# Patient Record
Sex: Female | Born: 1950 | Race: White | Hispanic: No | Marital: Single | Smoking: Never smoker
Health system: Southern US, Community
[De-identification: ages and names within clinical notes are randomized; demographics above are authoritative.]

## PROBLEM LIST (undated history)

## (undated) DIAGNOSIS — R011 Cardiac murmur, unspecified: Secondary | ICD-10-CM

## (undated) DIAGNOSIS — Z9889 Other specified postprocedural states: Secondary | ICD-10-CM

## (undated) DIAGNOSIS — R945 Abnormal results of liver function studies: Secondary | ICD-10-CM

## (undated) DIAGNOSIS — N200 Calculus of kidney: Secondary | ICD-10-CM

## (undated) DIAGNOSIS — F640 Transsexualism: Secondary | ICD-10-CM

## (undated) DIAGNOSIS — Z8619 Personal history of other infectious and parasitic diseases: Secondary | ICD-10-CM

## (undated) DIAGNOSIS — Z85828 Personal history of other malignant neoplasm of skin: Secondary | ICD-10-CM

## (undated) DIAGNOSIS — T148XXA Other injury of unspecified body region, initial encounter: Secondary | ICD-10-CM

## (undated) DIAGNOSIS — F4323 Adjustment disorder with mixed anxiety and depressed mood: Secondary | ICD-10-CM

## (undated) HISTORY — DX: Personal history of other malignant neoplasm of skin: Z85.828

## (undated) HISTORY — DX: Transsexualism: F64.0

## (undated) HISTORY — DX: Personal history of other infectious and parasitic diseases: Z86.19

## (undated) HISTORY — PX: MOHS SURGERY: SUR867

## (undated) HISTORY — DX: Other injury of unspecified body region, initial encounter: T14.8XXA

## (undated) HISTORY — DX: Abnormal results of liver function studies: R94.5

## (undated) HISTORY — PX: TONSILLECTOMY: SUR1361

## (undated) HISTORY — DX: Cardiac murmur, unspecified: R01.1

## (undated) HISTORY — DX: Adjustment disorder with mixed anxiety and depressed mood: F43.23

## (undated) HISTORY — DX: Other specified postprocedural states: Z98.890

## (undated) HISTORY — DX: Calculus of kidney: N20.0

---

## 1997-01-17 HISTORY — PX: OTHER SURGICAL HISTORY: SHX169

## 2001-03-19 HISTORY — PX: OTHER SURGICAL HISTORY: SHX169

## 2003-04-19 LAB — HM COLONOSCOPY: HM Colonoscopy: NORMAL

## 2008-03-06 ENCOUNTER — Ambulatory Visit: Payer: Self-pay | Admitting: Internal Medicine

## 2008-03-06 DIAGNOSIS — F4323 Adjustment disorder with mixed anxiety and depressed mood: Secondary | ICD-10-CM

## 2008-03-06 DIAGNOSIS — F640 Transsexualism: Secondary | ICD-10-CM | POA: Insufficient documentation

## 2008-03-06 HISTORY — DX: Adjustment disorder with mixed anxiety and depressed mood: F43.23

## 2008-03-06 LAB — CONVERTED CEMR LAB
AST: 36 units/L (ref 0–37)
Alkaline Phosphatase: 41 units/L (ref 39–117)
Bilirubin Urine: NEGATIVE
Bilirubin, Direct: 0.1 mg/dL (ref 0.0–0.3)
Blood in Urine, dipstick: NEGATIVE
Chloride: 102 meq/L (ref 96–112)
Cholesterol: 248 mg/dL (ref 0–200)
Eosinophils Absolute: 0.1 10*3/uL (ref 0.0–0.7)
Eosinophils Relative: 1.1 % (ref 0.0–5.0)
GFR calc Af Amer: 73 mL/min
GFR calc non Af Amer: 61 mL/min
Glucose, Bld: 90 mg/dL (ref 70–99)
HCT: 39.8 % (ref 36.0–46.0)
HDL: 79.5 mg/dL (ref >39.0)
Monocytes Absolute: 0.3 10*3/uL (ref 0.1–1.0)
Monocytes Relative: 6 % (ref 3.0–12.0)
Neutrophils Relative %: 64.7 % (ref 43.0–77.0)
Nitrite: NEGATIVE
Platelets: 261 10*3/uL (ref 150–400)
Potassium: 4.6 meq/L (ref 3.5–5.1)
Protein, U semiquant: NEGATIVE
RDW: 12.8 % (ref 11.5–14.6)
Sodium: 140 meq/L (ref 135–145)
Total CHOL/HDL Ratio: 3.1
Triglycerides: 48 mg/dL (ref 0–149)
Urobilinogen, UA: 0.2
VLDL: 10 mg/dL (ref 0–40)
WBC: 5.4 10*3/uL (ref 4.5–10.5)

## 2008-03-29 ENCOUNTER — Ambulatory Visit: Payer: Self-pay | Admitting: Internal Medicine

## 2008-03-29 LAB — CONVERTED CEMR LAB
Bilirubin Urine: NEGATIVE
Blood in Urine, dipstick: NEGATIVE
Glucose, Urine, Semiquant: NEGATIVE
Protein, U semiquant: NEGATIVE
Urobilinogen, UA: 0.2
pH: 7

## 2008-04-04 ENCOUNTER — Ambulatory Visit: Payer: Self-pay | Admitting: Internal Medicine

## 2008-04-04 DIAGNOSIS — R945 Abnormal results of liver function studies: Secondary | ICD-10-CM

## 2008-04-04 HISTORY — DX: Abnormal results of liver function studies: R94.5

## 2008-05-03 ENCOUNTER — Telehealth: Payer: Self-pay | Admitting: *Deleted

## 2008-05-05 ENCOUNTER — Ambulatory Visit: Payer: Self-pay | Admitting: Internal Medicine

## 2008-05-05 LAB — CONVERTED CEMR LAB
HCV Ab: NEGATIVE
Hep B C IgM: NEGATIVE
Hep B S Ab: POSITIVE — AB

## 2008-05-16 LAB — CONVERTED CEMR LAB
AST: 34 units/L (ref 0–37)
Alkaline Phosphatase: 36 units/L — ABNORMAL LOW (ref 39–117)
Bilirubin, Direct: 0.1 mg/dL (ref 0.0–0.3)
Total Bilirubin: 1.3 mg/dL — ABNORMAL HIGH (ref 0.3–1.2)

## 2008-06-26 ENCOUNTER — Telehealth: Payer: Self-pay | Admitting: Internal Medicine

## 2008-09-11 ENCOUNTER — Ambulatory Visit: Payer: Self-pay | Admitting: Internal Medicine

## 2008-09-19 LAB — CONVERTED CEMR LAB
Bilirubin, Direct: 0.1 mg/dL (ref 0.0–0.3)
Total Bilirubin: 1.4 mg/dL — ABNORMAL HIGH (ref 0.3–1.2)
Total Protein: 7.4 g/dL (ref 6.0–8.3)

## 2008-09-25 ENCOUNTER — Encounter: Admission: RE | Admit: 2008-09-25 | Discharge: 2008-09-25 | Payer: Self-pay | Admitting: Internal Medicine

## 2008-09-27 ENCOUNTER — Encounter: Payer: Self-pay | Admitting: Internal Medicine

## 2008-09-27 ENCOUNTER — Telehealth: Payer: Self-pay | Admitting: Internal Medicine

## 2008-09-28 ENCOUNTER — Telehealth: Payer: Self-pay | Admitting: Internal Medicine

## 2008-10-24 ENCOUNTER — Ambulatory Visit: Payer: Self-pay | Admitting: Internal Medicine

## 2008-10-27 LAB — CONVERTED CEMR LAB
ALT: 26 units/L (ref 0–35)
AST: 28 units/L (ref 0–37)
Alkaline Phosphatase: 34 units/L — ABNORMAL LOW (ref 39–117)
Bilirubin, Direct: 0.1 mg/dL (ref 0.0–0.3)
Total Protein: 7.3 g/dL (ref 6.0–8.3)

## 2009-01-30 ENCOUNTER — Ambulatory Visit: Payer: Self-pay | Admitting: Internal Medicine

## 2009-01-30 LAB — CONVERTED CEMR LAB
Albumin: 4 g/dL (ref 3.5–5.2)
Alkaline Phosphatase: 42 units/L (ref 39–117)
Total Protein: 7.2 g/dL (ref 6.0–8.3)

## 2009-02-05 ENCOUNTER — Ambulatory Visit: Payer: Self-pay | Admitting: Internal Medicine

## 2009-05-28 ENCOUNTER — Ambulatory Visit: Payer: Self-pay | Admitting: Internal Medicine

## 2009-05-28 LAB — CONVERTED CEMR LAB
Albumin: 3.9 g/dL (ref 3.5–5.2)
Basophils Absolute: 0 10*3/uL (ref 0.0–0.1)
CO2: 29 meq/L (ref 19–32)
Eosinophils Absolute: 0.1 10*3/uL (ref 0.0–0.7)
Glucose, Bld: 82 mg/dL (ref 70–99)
Glucose, Urine, Semiquant: NEGATIVE
HCT: 39.3 % (ref 36.0–46.0)
Hemoglobin: 13.2 g/dL (ref 12.0–15.0)
Lymphs Abs: 1.3 10*3/uL (ref 0.7–4.0)
MCHC: 33.6 g/dL (ref 30.0–36.0)
Neutro Abs: 3.3 10*3/uL (ref 1.4–7.7)
Nitrite: NEGATIVE
Potassium: 4.3 meq/L (ref 3.5–5.1)
RDW: 13.4 % (ref 11.5–14.6)
Sodium: 140 meq/L (ref 135–145)
Specific Gravity, Urine: 1.02
TSH: 0.83 microintl units/mL (ref 0.35–5.50)
Triglycerides: 73 mg/dL (ref 0.0–149.0)

## 2009-06-01 ENCOUNTER — Ambulatory Visit: Payer: Self-pay | Admitting: Internal Medicine

## 2009-09-10 ENCOUNTER — Ambulatory Visit: Payer: Self-pay | Admitting: Internal Medicine

## 2009-09-10 LAB — CONVERTED CEMR LAB
Bilirubin, Direct: 0 mg/dL (ref 0.0–0.3)
Total Bilirubin: 1.1 mg/dL (ref 0.3–1.2)
Total Protein: 7.6 g/dL (ref 6.0–8.3)

## 2009-09-14 LAB — CONVERTED CEMR LAB: Anti Nuclear Antibody(ANA): NEGATIVE

## 2009-11-10 IMAGING — US US ABDOMEN COMPLETE
1 series · 14 of 25 positions shown · non-contrast
Comparison: None

CLINICAL DATA: Abnormal liver function tests.

COMPLETE ABDOMINAL ULTRASOUND

[Series 1: us abdomen complete · 0.22mm/px · 14 of 77 slices shown]
[im 1/77]
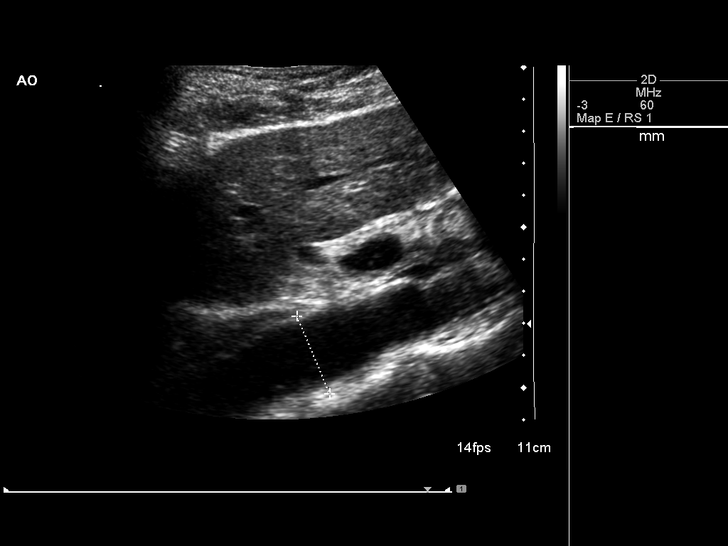
[im 7/77]
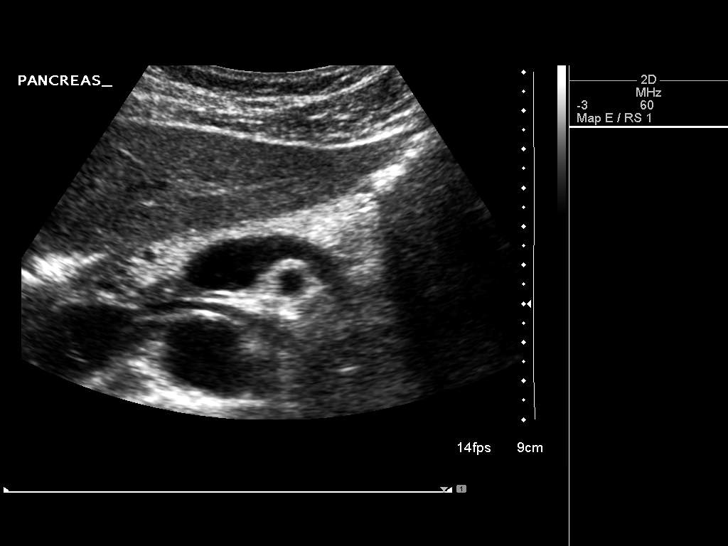
[im 13/77]
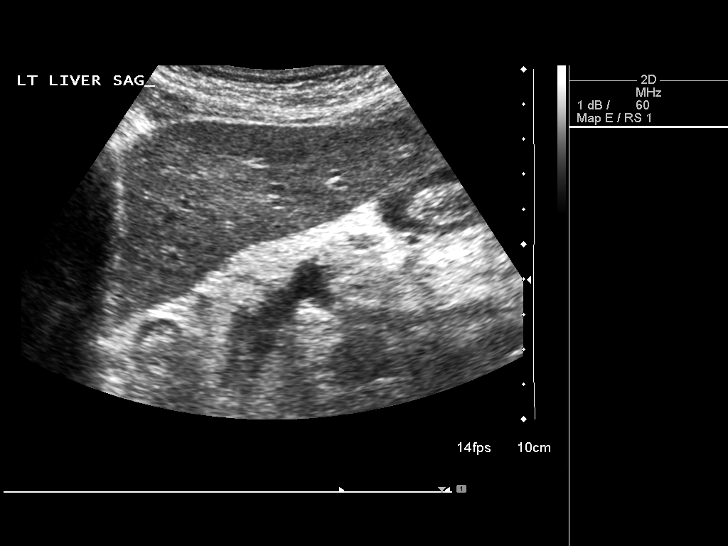
[im 20/77]
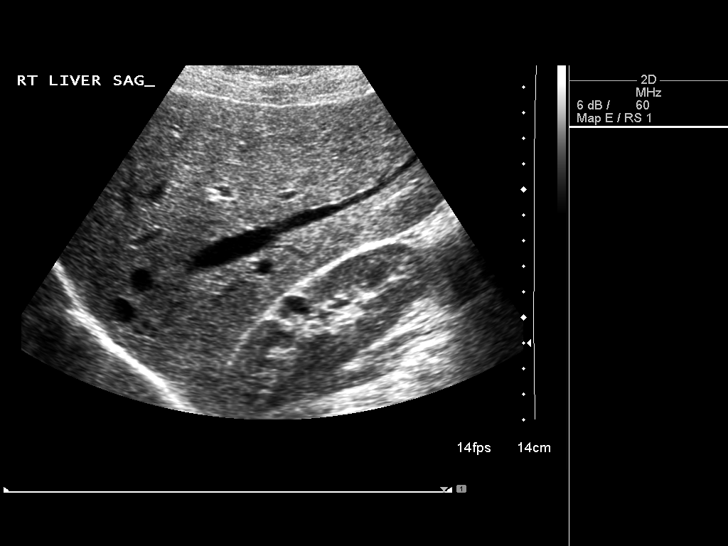
[im 26/77]
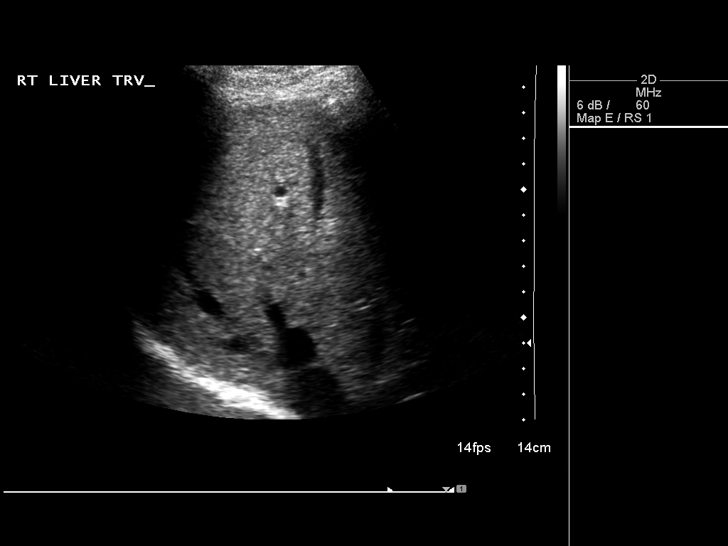
[im 29/77]
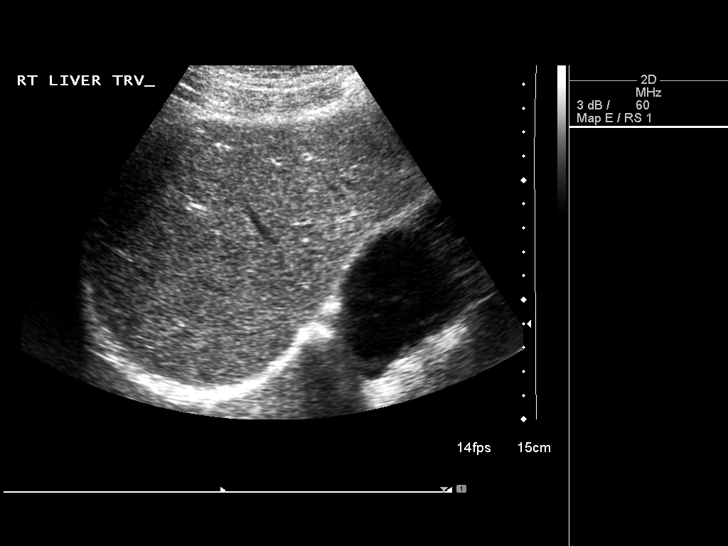
[im 35/77]
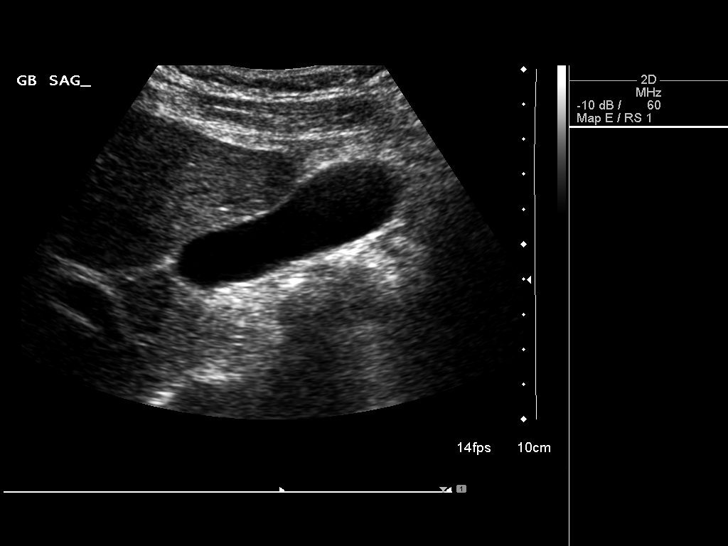
[im 42/77]
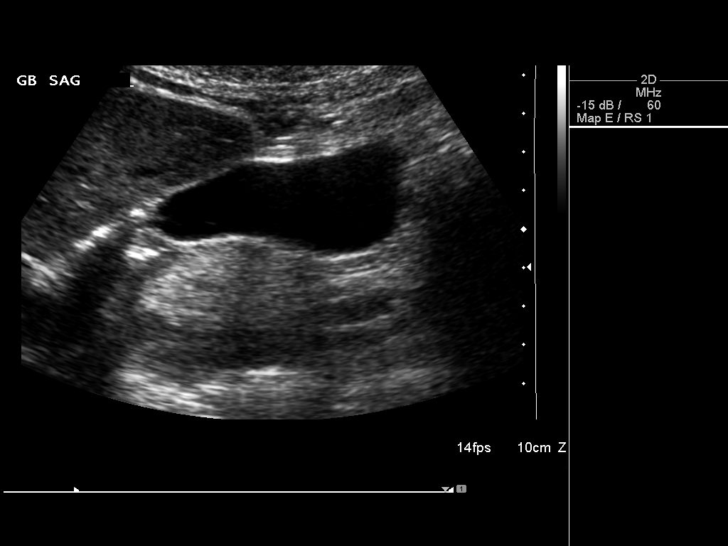
[im 48/77]
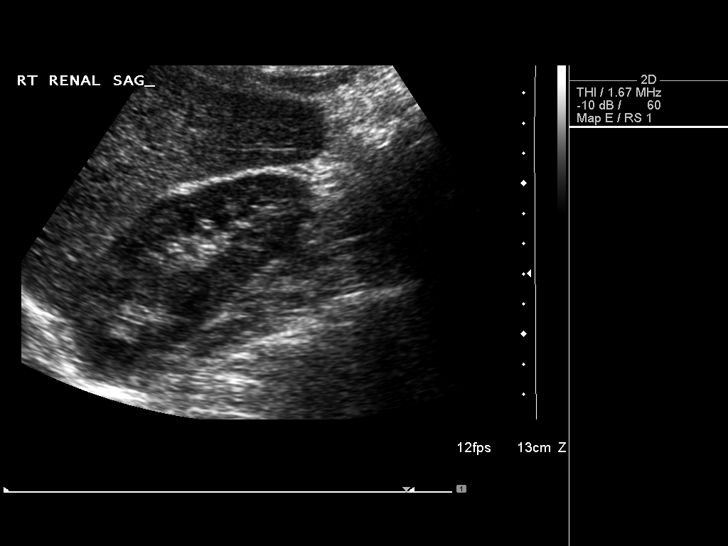
[im 51/77]
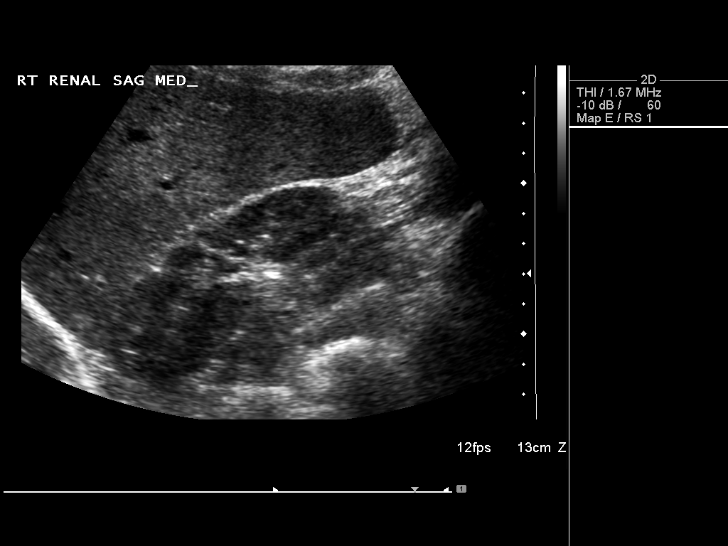
[im 58/77]
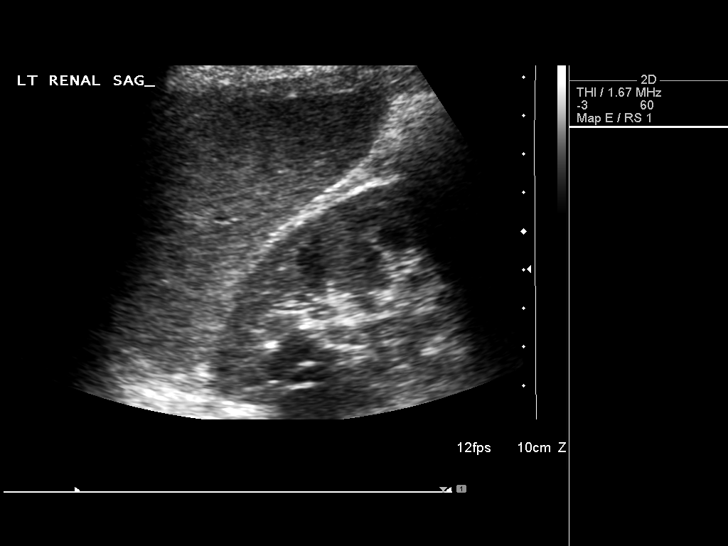
[im 64/77]
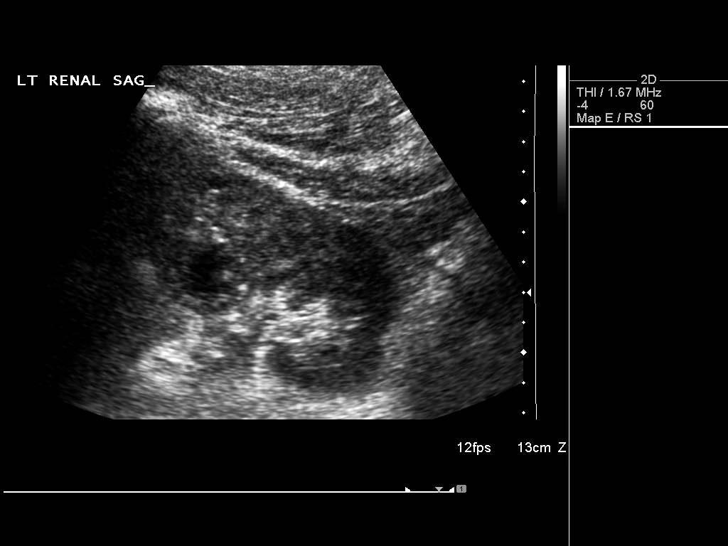
[im 70/77]
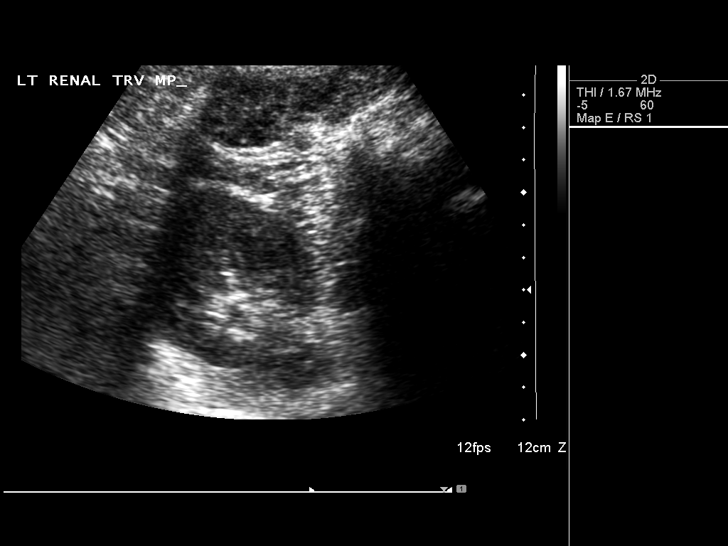
[im 77/77]
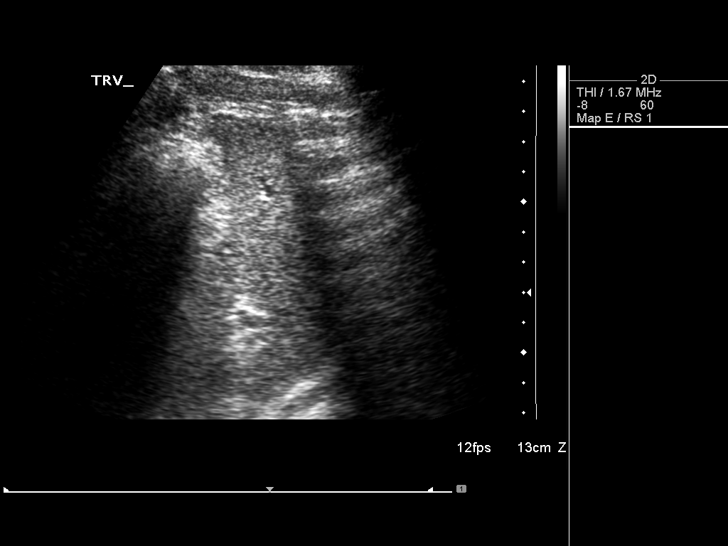

[14 of 25 positions shown; findings below may reference images not displayed]

FINDINGS: Gallbladder:  Negative.

Common bile duct:  4 mm, within normal limits.

Liver:  Negative.

IVC:  Visualized.

Pancreas:  Negative.

Spleen:  3.6 cm, negative.

Right Kidney:  9.9 cm, negative.

Left Kidney:  10.4 cm.  Contains a 2.3 x 2.2 x 2.3 cm anechoic
lesion with increased through transmission, consistent with a cyst.

Abdominal aorta:  Measures up to 2.6 cm.

Other Findings:  None.
IMPRESSION: No acute findings.

## 2010-06-18 NOTE — Assessment & Plan Note (Signed)
Summary: cpx/no pap/njr   Vital Signs:  Patient profile:   60 year old female Menstrual status:  Other Height:      69 inches Weight:      164 pounds Pulse rate:   72 / minute BP sitting:   134 / 80  (right arm) Cuff size:   regular  Vitals Entered By: Romualdo Bolk, CMA (AAMA) (June 01, 2009 2:17 PM) CC: CPX- no pap   History of Present Illness: Carol Mcdaniel comes in today for   prevevntive visit. Since last visit  here  there have been no major changes in health status  .   She has been doing marathon running and without injury cp sob .  Feels well and mood stable. NO use of xanax for  a long time .  continues on meds and estrogen patch.   UTD on colonoscopy .  SPot on left arm  persists about the same.   Preventive Care Screening  Colonoscopy:    Date:  04/19/2003    Results:  normal   Last Tetanus Booster:    Date:  05/19/2000    Results:  Historical    Preventive Screening-Counseling & Management  Alcohol-Tobacco     Alcohol drinks/day: <1     Alcohol type: wine     Smoking Status: never  Caffeine-Diet-Exercise     Caffeine use/day: 4     Does Patient Exercise: yes     Type of exercise: Running     Times/week: 6  Hep-HIV-STD-Contraception     Dental Visit-last 6 months yes     Sun Exposure-Excessive: no  Safety-Violence-Falls     Seat Belt Use: yes     Firearms in the Home: no firearms in the home     Smoke Detectors: yes  Current Medications (verified): 1)  Zoloft 100 Mg Tabs (Sertraline Hcl) .Marland Kitchen.. 1  and 1/2 By Mouth Once Daily 2)  Alprazolam 0.25 Mg Tabs (Alprazolam) .Marland Kitchen.. 1 Tab Every 4-6 Hours As Needed 3)  Climara 0.1 Mg/24hr Ptwk (Estradiol) .Marland Kitchen.. 1 Weekly  Allergies (verified): No Known Drug Allergies  Past History:  Past medical, surgical, family and social histories (including risk factors) reviewed, and no changes noted (except as noted below).  Past Medical History: Reviewed history from 04/04/2008 and no changes  required. Transgender Chicken Pox  ?Renal stone   no lab tests. Heart murmur  no eval  ? functional  Fx arm in HS Colonoscopy  2004   normal . Td  2001    Past Surgical History: Reviewed history from 03/06/2008 and no changes required. 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 sisters 2    brother 1   overweight Father alcoholic died 31     Social History: Reviewed history from 04/04/2008 and no changes required. [place] from [place]  lived in [place] locations Never Smoked Alcohol use-yes  rare  to none now.  Drug use-no Regular exercise-yes  running 30- 50 miles per week.  2 dogs and cat 6 hours sleep hs     Occupation:  Higher ed Production designer, theatre/television/film PHD.   HH of 1  Dental Care w/in 6 mos.:  yes Sun Exposure-Excessive:  no Seat Belt Use:  yes  Review of Systems  The patient denies anorexia, fever, weight loss, weight gain, vision loss, decreased hearing, hoarseness, chest pain, syncope, dyspnea on exertion, peripheral edema, prolonged cough, headaches,  hemoptysis, abdominal pain, melena, hematochezia, severe indigestion/heartburn, hematuria, muscle weakness, suspicious skin lesions, transient blindness, difficulty walking, depression, unusual weight change, abnormal bleeding, enlarged lymph nodes, angioedema, and breast masses.   Physical Exam General Appearance: well developed, well nourished, no acute distress Eyes: conjunctiva and lids normal, PERRLA, EOMI, WNL Ears, Nose, Mouth, Throat: TM clear, nares clear, oral exam WNL Neck: supple, no lymphadenopathy, no thyromegaly, no JVD Respiratory: clear to auscultation and percussion, respiratory effort normal Cardiovascular: regular rate and rhythm, S1-S2, no murmur, rub or gallop, no bruits, peripheral pulses normal and symmetric, no cyanosis, clubbing, edema or varicosities Chest:  no scars, masses, tenderness; no asymmetry, skin changes, nipple discharge   Gastrointestinal: soft, non-tender; no hepatosplenomegaly, masses; active bowel sounds all quadrants,  Lymphatic: no cervical, axillary or inguinal adenopathy Musculoskeletal: gait normal, muscle tone and strength WNL, no joint swelling, effusions, discoloration, crepitus  Skin: clear, good turgor, color WNL, no rashes, , or ulcerations  ther is a 2-3 mm red scaly lesion left forearm  actinic appearing.  Neurologic: normal mental status, normal reflexes, normal strength, sensation, and motion Psychiatric: alert; oriented to person, place and time Other Exam:     Impression & Recommendations:  Problem # 1:  PREVENTIVE HEALTH CARE (ICD-V70.0) Discussed nutrition,exercise,diet,healthy weight, vitamin D and calcium.    ldl could be better but this is probably genetic as otherwise  lifestyle  is heart healthy.   Problem # 2:  TRANSSEXUAL (ICD-302.50) Assessment: Comment Only  Problem # 3:  PROPH USE OTH AGENT AFFECT ER & ESTROGEN LEVELS (ICD-V07.59) Assessment: Comment Only  Problem # 4:  LIVER FUNCTION TESTS, ABNORMAL (ICD-794.8) was nl in 2009 before marathon running.   now back up having run 2 marathons just before  lab done.    nl [place] in the past and other neg results. no symptom and will follow.    Problem # 5:  ? of ACTINIC KERATOSIS (ICD-702.0) poss actinic keratosis vs  other.    disc options and will see derm call us if need referral.   Problem # 6:  ADJ DISORDER WITH MIXED ANXIETY & DEPRESSED MOOD (ICD-309.28) Assessment: Improved  Complete Medication List: 1)  Zoloft 100 Mg Tabs (Sertraline hcl) .Marland Kitchen.. 1  and 1/2 by mouth once daily 2)  Alprazolam 0.25 Mg Tabs (Alprazolam) .Marland Kitchen.. 1 tab every 4-6 hours as needed 3)  Climara 0.1 Mg/24hr Ptwk (Estradiol) .Marland KitchenMarland KitchenMarland Kitchen 1 weekly  Other Orders: Admin 1st Vaccine (13086) Flu Vaccine 33yrs + (57846)  Patient Instructions: 1)  see dermatologist   about the skin area  of concern.  2)  will continue to monitor your LFTs  could be related to  marathon running.    3)  Recheck lfts  with ana, ceruloplasma, in 3-4 months   4)  follow up depending on results.   Contraindications/Deferment of Procedures/Staging:    Test/Procedure: Mammogram    Reason for deferment: not indicated     Test/Procedure: PAP Smear    Reason for deferment: not indicated     Flu Vaccine Consent Questions     Do you have a history of severe allergic reactions to this vaccine? no    Any prior history of allergic reactions to egg and/or gelatin? no    Do you have a sensitivity to the preservative Thimersol? no    Do you have a past history of Guillan-Barre Syndrome? no    Do you currently have an acute febrile illness? no    Have you ever had a severe  reaction to latex? no    Vaccine information given and explained to patient? yes    Are you currently pregnant? no    Lot Number:[id number]   Exp Date:11/15/2009   Site Given  Left Deltoid IMu Romualdo Bolk, CMA (AAMA)  June 01, 2009 2:20 PM

## 2010-06-30 ENCOUNTER — Other Ambulatory Visit: Payer: Self-pay | Admitting: Internal Medicine

## 2010-07-01 NOTE — Telephone Encounter (Signed)
Pt needs to schedule a cpx before next refill. Letter sent to pt. 

## 2010-07-28 ENCOUNTER — Other Ambulatory Visit: Payer: Self-pay | Admitting: Internal Medicine

## 2010-08-08 ENCOUNTER — Other Ambulatory Visit: Payer: Self-pay | Admitting: Internal Medicine

## 2010-08-19 ENCOUNTER — Other Ambulatory Visit: Payer: Self-pay | Admitting: Internal Medicine

## 2010-09-16 ENCOUNTER — Other Ambulatory Visit: Payer: Self-pay | Admitting: Internal Medicine

## 2010-10-16 ENCOUNTER — Other Ambulatory Visit: Payer: Self-pay | Admitting: Internal Medicine

## 2010-11-01 ENCOUNTER — Encounter: Payer: Self-pay | Admitting: Internal Medicine

## 2010-11-01 DIAGNOSIS — T148XXA Other injury of unspecified body region, initial encounter: Secondary | ICD-10-CM | POA: Insufficient documentation

## 2010-11-01 DIAGNOSIS — R011 Cardiac murmur, unspecified: Secondary | ICD-10-CM | POA: Insufficient documentation

## 2010-11-12 ENCOUNTER — Other Ambulatory Visit (INDEPENDENT_AMBULATORY_CARE_PROVIDER_SITE_OTHER): Payer: BC Managed Care – PPO

## 2010-11-12 DIAGNOSIS — Z0000 Encounter for general adult medical examination without abnormal findings: Secondary | ICD-10-CM

## 2010-11-12 LAB — LIPID PANEL
Total CHOL/HDL Ratio: 4
Triglycerides: 115 mg/dL (ref 0.0–149.0)

## 2010-11-12 LAB — CBC WITH DIFFERENTIAL/PLATELET
Basophils Relative: 0.7 % (ref 0.0–3.0)
Eosinophils Relative: 0.7 % (ref 0.0–5.0)
MCV: 93.3 fl (ref 78.0–100.0)
Monocytes Absolute: 0.5 10*3/uL (ref 0.1–1.0)
Monocytes Relative: 9.2 % (ref 3.0–12.0)
Neutrophils Relative %: 66 % (ref 43.0–77.0)
RBC: 4.55 Mil/uL (ref 3.87–5.11)
WBC: 5.6 10*3/uL (ref 4.5–10.5)

## 2010-11-12 LAB — POCT URINALYSIS DIPSTICK
Glucose, UA: NEGATIVE
Leukocytes, UA: NEGATIVE
Nitrite, UA: NEGATIVE

## 2010-11-12 LAB — BASIC METABOLIC PANEL
Chloride: 102 mEq/L (ref 96–112)
Creatinine, Ser: 0.9 mg/dL (ref 0.4–1.2)
GFR: 66.96 mL/min (ref >60.00)
Potassium: 4.5 mEq/L (ref 3.5–5.1)

## 2010-11-12 LAB — HEPATIC FUNCTION PANEL
ALT: 28 U/L (ref 0–35)
AST: 32 U/L (ref 0–37)
Alkaline Phosphatase: 43 U/L (ref 39–117)
Bilirubin, Direct: 0.2 mg/dL (ref 0.0–0.3)
Total Protein: 7.3 g/dL (ref 6.0–8.3)

## 2010-11-13 LAB — TSH: TSH: 0.89 u[IU]/mL (ref 0.35–5.50)

## 2010-11-18 ENCOUNTER — Other Ambulatory Visit: Payer: Self-pay | Admitting: Internal Medicine

## 2010-11-19 ENCOUNTER — Ambulatory Visit (INDEPENDENT_AMBULATORY_CARE_PROVIDER_SITE_OTHER): Payer: BC Managed Care – PPO | Admitting: Internal Medicine

## 2010-11-19 ENCOUNTER — Encounter: Payer: Self-pay | Admitting: Internal Medicine

## 2010-11-19 VITALS — BP 120/80 | HR 60 | Ht 69.25 in | Wt 169.0 lb

## 2010-11-19 DIAGNOSIS — Z0000 Encounter for general adult medical examination without abnormal findings: Secondary | ICD-10-CM

## 2010-11-19 DIAGNOSIS — R945 Abnormal results of liver function studies: Secondary | ICD-10-CM

## 2010-11-19 DIAGNOSIS — E785 Hyperlipidemia, unspecified: Secondary | ICD-10-CM

## 2010-11-19 DIAGNOSIS — F640 Transsexualism: Secondary | ICD-10-CM

## 2010-11-19 DIAGNOSIS — F4323 Adjustment disorder with mixed anxiety and depressed mood: Secondary | ICD-10-CM

## 2010-11-19 DIAGNOSIS — Z2911 Encounter for prophylactic immunotherapy for respiratory syncytial virus (RSV): Secondary | ICD-10-CM

## 2010-11-19 DIAGNOSIS — Z23 Encounter for immunization: Secondary | ICD-10-CM

## 2010-11-19 MED ORDER — ALPRAZOLAM 0.25 MG PO TABS: 0.2500 mg | ORAL_TABLET | Freq: Every evening | ORAL | Status: DC | PRN

## 2010-11-19 NOTE — Patient Instructions (Signed)
Continue lifestyle intervention healthy eating and exercise .  Decrease creamy foods. Consider lowering zoloft to 100 mg ans see how you do. If doing ok can do check up in a year  .  Cholesterol Control Modern scientists have known for a long time that cholesterol levels in the body are related to coronary heart disease and that diet affects these levels. The following material helps to explain this relationship and discusses what you can do to help keep your heart healthy. Not all cholesterol is bad. Low-density lipoprotein (LDL) cholesterol is the "bad" cholesterol which may cause fatty deposits to build up inside your arteries. High-density lipoprotein (HDL) cholesterol is "good". It helps to remove the "bad" LDL cholesterol from your blood. Cholesterol is a very important risk factor for coronary heart disease. Other risk factors are high blood pressure, smoking, stress, heredity, and weight. The heart muscle gets its supply of blood through the coronary arteries. If your LDL ("bad") cholesterol is high and your HDL ("good") cholesterol is low, you have a risk factor for fatty deposits accumulating in your coronary arteries (a vessel providing blood to the heart). This leaves less room through which blood can flow. Without sufficient blood and the oxygen, the heart muscle cannot function properly, and you may feel chest pains (called angina pectoris). When a coronary artery closes up entirely, a part of the heart muscle may die (called a myocardial infarction). CHECKING CHOLESTEROL When your caregiver sends your blood to a lab to be analyzed for cholesterol, they may do a complete lipid (fat) profile. With this test the total amount of cholesterol, as well as levels of LDL and HDL, are determined. The chart below describes what the numbers should be: Test Goal  Total Cholesterol Less than 200 mg/dl  LDL "bad cholesterol" Less than 160 mg/dl for those at low risk for heart disease Less than 130 mg/dl  for those at intermediate risk for heart disease. Less than 100 mg/dl for those with diabetes or at high risk for heart disease Less than 70 mg/dl for those at very high risk for heart disease  HDL "good cholesterol" Women: Greater than 50 mg/dl Men: Greater than 40 mg/dl  Triglycerides Less than 150 mg/dl  Reference: [email or web address] 06/02/06 CONTROLLING CHOLESTEROL WITH DIET Although such factors as exercise and life-style are important, the "first line of attack" is diet. That is because certain foods are known to raise cholesterol and others to lower it. So the goal for most Americans is to balance foods for their effect on cholesterol and, even more important, to replace saturated fat with other types of fat, such as monounsaturated and polyunsaturated fats, and omega-3 fatty acids. The average American takes in about 500 to 600 milligrams (mg) of cholesterol and about 40 grams (g) of saturated fat daily. Ideally, these figures should be reduced to 300 mg of cholesterol and 15 to 17 g of saturated fat, or even lower in people who have coronary artery disease or a history of heart attack. But that does not mean you have to sacrifice all your favorite foods. Today, as the table at the end of this document shows, there are good-tasting, low-fat, low-cholesterol substitutes for most of the things you like to eat. Which foods should you choose? Choose low-fat or non-fat alternatives. Choose round or loin cuts of red meat as these types of cuts are lowest in fat and cholesterol. Chicken (without the skin), fish, veal and ground [place] breast are excellent choices. Eliminate fatty meats like hotdogs  and salami. Even shellfish have little or no saturated fat and so, despite their high cholesterol content, are allowable in moderation. When you eat lean meat, poultry, or fish, have a 3 oz. portion. For baking and cooking, oils are an excellent substitute for butter. The monounsaturated oils are  of particular benefit since it is believed they lower LDL (the bad cholesterol) but raise HDL. The oils you should avoid entirely are the saturated tropical oils such as coconut and palm.  Remember to eat liberally from food groups that are naturally free of cholesterol and saturated fat, including fish, fruit, vegetables, beans, grains (barley, rice, couscous, bul-gur wheat), and pasta (without cream sauces).  IDENTIFYING FOODS THAT LOWER CHOLESTEROL . . . Soluble fiber found in fruits such as apples; vegetables such as broccoli, potatoes, and carrots; legumes such as beans, peas, and lentils; and grains such as barley may lower your cholesterol. Foods fortified with plant sterols, or phytosterol, may also lower cholesterol. You should eat at least 2g per day of these foods for a cholesterol lowering effect.  How can you identify low-cholesterol, low-fat foods at the supermarket? The key is to read package labels. Select cheeses that have only 2-3 g saturated fat per ounce. Use a heart healthy tub margarine free of partially hydrogenated oil such as Insurance claims handler. When buying baked goods (cookies, crackers), avoid partially hydrogenated oils. Breads and muffins should be made from whole grains (whole wheat or whole oat flour, instead of just "flour" or "enriched flour"). Buy non-creamy canned soups with reduced salt and no added fats.  FOOD PREPARATION TECHNIQUES . . . Never deep fry. If you must fry, either stir fry, which uses very little fat, or use the non-stick cooking sprays like Pam. When possible, broil, bake, or roast meats, and steam vegetables. Instead of dressing vegetables with butter or margarine, use lemon and herbs, applesauce and cinnamon (for squash and sweet potatoes), non-fat yogurt, salsa, and low-fat dressings for salads.  See the following table for food, cholesterol, and fat information. FOODS HIGH IN CHOLESTEROL AND/OR SATURATED FAT LOW CHOLESTEROL/LOW-FAT SUBSTITUTES     Cholesterol (milligrams) Saturated Fat (grams)  Cholesterol (milligrams) Saturated Fat (grams)  Steak, marbled (3 oz) 90 11 Steak, lean (3 oz) 50 4  Hamburger (3 oz) 80 7 Hamburger, lean (3 oz) 50 5  Ham (3 oz) 53 6 Ham, lean cut (3 oz) 35 2.4  Chicken, with skin  (3 oz)   Chicken, skin removed, 3 oz    Dark meat 90 4 Dark meat 80 2  Light meat 80 2.5 Light meat 70 1  Egg (1 Large) 200-300 1.7 Egg substitutes ("Eggbeaters") 0 0  Whole Milk (1 cup) 33 5 Low-fat milk (2%)  (1 cup) 18 3     Low-fat milk (1%)  (1 cup) 10 1.5     Skim milk (1 cup) trace 0.3  Hard Cheese (1 oz) 30 6 Skim milk cheese  (1 oz) 16 2-3  Cottage Cheese  (1 cup) (4% fat) 35 6.5 Low-fat cottage cheese (1 cup) (1% fat) 10 1.5  Ice cream (1 cup) 60 9 Sherbet (1 cup) 14 2.5     Non-fat frozen yogurt (1 cup) 0 0.3     Frozen fruit bars 0 trace  Whipped cream  (1 tbsp) 20 3.5 Non-dairy whipped toppings (1 tbsp) trace 1  Mayonnaise  (1 tbsp) 5 2 Low-fat mayonnaise (1 tbsp) 5 1  Butter (1 tbsp) 30 7 Extra light margarines ( tbsp)  0 1  Oils (1 tbsp)   Oils (1 tbsp)    Saturated   Monounsaturated    Coconut oil 0 11.8 Olive 0 1.8     Polyunsaturated       Corn 0 1.7     Safflower 0 1.2     Sunflower 0 1.4     Soybean 0 2.4  Document Released: 05/05/2005 Document Re-Released: 03/02/2007 [facility] Patient Information 2011 [place], [place].

## 2010-11-19 NOTE — Assessment & Plan Note (Signed)
resolved 

## 2010-11-19 NOTE — Progress Notes (Signed)
  Subjective:    Patient ID: Carol Mcdaniel, female    DOB: April 27, 1951, 60 y.o.   MRN: [id number]  HPI Patient comes in today for Preventive Health Care visit  No major change in health status since last visit . 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 She is feeling much less stressed and feels very well. She's currently on sabbatical. MOOD: still on zoloft using xanax rarely Diet : generally  healthyLittle red meat.   Some eggs.    Review of Systems ROS:  GEN/ HEENTNo fever, significant weight changes sweats headaches vision problems hearing changes, CV/ PULM; No chest pain shortness of breath cough, syncope,edema  change in exercise tolerance. GI /GU: No adominal pain, vomiting, change in bowel habits. No blood in the stool. No significant GU symptoms. SKIN/HEME: ,no acute skin rashes suspicious lesions or bleeding. No lymphadenopathy, nodules, masses.  NEURO/ PSYCH:  No neurologic signs such as weakness numbness No depression anxiety. IMM/ Allergy: No unusual infections.  Allergy .   REST of 12 system review negative Sleep   5-8 hours of sleep.   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      Past history family history social history reviewed in the electronic medical record.  Objective:   Physical Exam Physical Exam: Vital signs reviewed WUJ:WJXB is a well-developed well-nourished alert cooperative   who appears her stated age in no acute distress.  HEENT: normocephalic  traumatic , Eyes: PERRL EOM's full, conjunctiva clear, Nares: paten,t no deformity discharge or tenderness., Ears: no deformity EAC's clear TMs with normal landmarks. Mouth: clear OP, no lesions, edema.  Moist mucous membranes. Dentition in adequate repair. Breast: normal by inspection . No dimpling, discharge, masses, tenderness or discharge . NECK: supple without masses,  thyromegaly or bruits. CHEST/PULM:  Clear to auscultation and percussion breath sounds equal no wheeze , rales or rhonchi. No chest wall deformities or tenderness. CV: PMI is nondisplaced, S1 S2 no gallops, murmurs, rubs. Peripheral pulses are full without delay.No JVD .  ABDOMEN: Bowel sounds normal nontender  No guard or rebound, no hepato splenomegal no CVA tenderness.  No hernia. Extremtities:  No clubbing cyanosis or edema, no acute joint swelling or redness no focal atrophy NEURO:  Oriented x3, cranial nerves 3-12 appear to be intact, no obvious focal weakness,gait within normal limits no abnormal reflexes or asymmetrical SKIN: No acute rashes normal turgor, color, no bruising or petechiae. PSYCH: Oriented, good eye contact, no obvious depression anxiety, cognition and judgment appear normal. Rectal small hemorrhoidal tag  No masses stool hem neg Labs reviewed       Assessment & Plan:  Preventive Health Care Counseled regarding healthy nutrition, exercise, sleep, injury prevention, calcium vit d and healthy weight . Continue  UTD on parameters tdap and zostavax given today MOOD doing very well consider weaning when wishes  Refill xanax as needed rare use.  Elevated lipids  Higher recently ratio 4    Counseled.  Dietary modification .  Transgender healthy.

## 2010-12-16 ENCOUNTER — Other Ambulatory Visit: Payer: Self-pay | Admitting: Internal Medicine

## 2011-01-13 ENCOUNTER — Other Ambulatory Visit: Payer: Self-pay | Admitting: Internal Medicine

## 2011-05-20 DIAGNOSIS — Z85828 Personal history of other malignant neoplasm of skin: Secondary | ICD-10-CM

## 2011-05-20 DIAGNOSIS — Z9889 Other specified postprocedural states: Secondary | ICD-10-CM

## 2011-05-20 HISTORY — DX: Personal history of other malignant neoplasm of skin: Z85.828

## 2011-05-20 HISTORY — DX: Other specified postprocedural states: Z98.890

## 2011-07-05 ENCOUNTER — Other Ambulatory Visit: Payer: Self-pay | Admitting: Internal Medicine

## 2011-12-09 ENCOUNTER — Other Ambulatory Visit: Payer: Self-pay | Admitting: Internal Medicine

## 2012-01-04 ENCOUNTER — Other Ambulatory Visit: Payer: Self-pay | Admitting: Internal Medicine

## 2012-01-05 NOTE — Telephone Encounter (Signed)
Pt not seen in over 1 year.  Needs CPX.  Left message on voicemail for her to return my call.

## 2012-01-09 ENCOUNTER — Other Ambulatory Visit: Payer: Self-pay | Admitting: Family Medicine

## 2012-01-09 ENCOUNTER — Telehealth: Payer: Self-pay | Admitting: Internal Medicine

## 2012-01-09 MED ORDER — ALPRAZOLAM 0.25 MG PO TABS: 0.2500 mg | ORAL_TABLET | Freq: Every evening | ORAL | Status: DC | PRN

## 2012-01-09 NOTE — Telephone Encounter (Signed)
[facility] in [place], [place] called. Pt req script for ALPRAZolam (XANAX) 0.25 MG tablet. Pt has schd a cpx for 05/31/12 and fasting cpx labs on 05/25/11.

## 2012-01-09 NOTE — Telephone Encounter (Signed)
Last seen 11-19-10.  Please advise.  Thanks!!!

## 2012-01-09 NOTE — Telephone Encounter (Signed)
Also requesting a refill of her Zoloft.  Please advise.

## 2012-01-09 NOTE — Telephone Encounter (Signed)
Ok to refill x 1  

## 2012-01-09 NOTE — Telephone Encounter (Signed)
Both printed and faxed to the pharmacy.

## 2012-01-09 NOTE — Telephone Encounter (Signed)
Ok to refill until CPX  If stable

## 2012-03-24 ENCOUNTER — Telehealth: Payer: Self-pay | Admitting: Family Medicine

## 2012-03-24 NOTE — Telephone Encounter (Signed)
Ok x 1

## 2012-03-24 NOTE — Telephone Encounter (Signed)
Received fax from [facility].  Pt is requesting a refill.  Last seen on 11/19/10 (cpe) and has CPE scheduled on 05/31/12.  Last filled 01/09/12 #30 with 0 additional refills.  Please advise.  Thanks!!!

## 2012-03-25 ENCOUNTER — Other Ambulatory Visit: Payer: Self-pay | Admitting: Family Medicine

## 2012-03-25 MED ORDER — ALPRAZOLAM 0.25 MG PO TABS: 0.2500 mg | ORAL_TABLET | Freq: Every evening | ORAL | Status: DC | PRN

## 2012-03-25 NOTE — Telephone Encounter (Signed)
Called and left on voicemail. 

## 2012-04-20 ENCOUNTER — Other Ambulatory Visit: Payer: Self-pay | Admitting: Internal Medicine

## 2012-04-21 ENCOUNTER — Telehealth: Payer: Self-pay | Admitting: Internal Medicine

## 2012-04-21 NOTE — Telephone Encounter (Signed)
Ok x 1

## 2012-04-21 NOTE — Telephone Encounter (Signed)
Pt is requesting refills.  Last seen for CPE on 11/19/10.  Has a fu scheduled on 05/31/12.  Last filled on 03/25/12 # 30.  Please advise.  Thanks!!

## 2012-04-21 NOTE — Telephone Encounter (Signed)
Also asking for estradiol.  Please advise.  Thanks!!

## 2012-04-22 ENCOUNTER — Other Ambulatory Visit: Payer: Self-pay | Admitting: Family Medicine

## 2012-04-22 MED ORDER — ALPRAZOLAM 0.25 MG PO TABS: 0.2500 mg | ORAL_TABLET | Freq: Every evening | ORAL | Status: DC | PRN

## 2012-04-22 NOTE — Telephone Encounter (Signed)
Xanax called to the pharmacy and left on voicemail.

## 2012-05-24 ENCOUNTER — Other Ambulatory Visit: Payer: BC Managed Care – PPO

## 2012-05-29 ENCOUNTER — Other Ambulatory Visit: Payer: Self-pay | Admitting: Internal Medicine

## 2012-05-31 ENCOUNTER — Encounter: Payer: Self-pay | Admitting: Internal Medicine

## 2012-05-31 ENCOUNTER — Ambulatory Visit (INDEPENDENT_AMBULATORY_CARE_PROVIDER_SITE_OTHER): Payer: BC Managed Care – PPO | Admitting: Internal Medicine

## 2012-05-31 VITALS — BP 118/86 | HR 82 | Temp 98.0°F | Ht 69.25 in | Wt 172.0 lb

## 2012-05-31 DIAGNOSIS — Z23 Encounter for immunization: Secondary | ICD-10-CM

## 2012-05-31 DIAGNOSIS — E785 Hyperlipidemia, unspecified: Secondary | ICD-10-CM

## 2012-05-31 DIAGNOSIS — M24549 Contracture, unspecified hand: Secondary | ICD-10-CM

## 2012-05-31 DIAGNOSIS — F4323 Adjustment disorder with mixed anxiety and depressed mood: Secondary | ICD-10-CM

## 2012-05-31 DIAGNOSIS — R0789 Other chest pain: Secondary | ICD-10-CM

## 2012-05-31 DIAGNOSIS — Z0000 Encounter for general adult medical examination without abnormal findings: Secondary | ICD-10-CM

## 2012-05-31 LAB — CBC WITH DIFFERENTIAL/PLATELET
Basophils Absolute: 0 10*3/uL (ref 0.0–0.1)
Eosinophils Relative: 0.6 % (ref 0.0–5.0)
HCT: 38.3 % (ref 36.0–46.0)
Hemoglobin: 12.9 g/dL (ref 12.0–15.0)
Lymphocytes Relative: 20.9 % (ref 12.0–46.0)
Lymphs Abs: 1.3 10*3/uL (ref 0.7–4.0)
Monocytes Relative: 5.5 % (ref 3.0–12.0)
Neutro Abs: 4.4 10*3/uL (ref 1.4–7.7)
Platelets: 264 10*3/uL (ref 150.0–400.0)
RDW: 13.9 % (ref 11.5–14.6)
WBC: 6.1 10*3/uL (ref 4.5–10.5)

## 2012-05-31 LAB — BASIC METABOLIC PANEL
Calcium: 9 mg/dL (ref 8.4–10.5)
GFR: 67.47 mL/min (ref >60.00)
Potassium: 3.8 mEq/L (ref 3.5–5.1)
Sodium: 137 mEq/L (ref 135–145)

## 2012-05-31 LAB — LIPID PANEL
HDL: 69.8 mg/dL (ref >39.00)
Total CHOL/HDL Ratio: 4
Triglycerides: 102 mg/dL (ref 0.0–149.0)

## 2012-05-31 LAB — HEPATIC FUNCTION PANEL
ALT: 27 U/L (ref 0–35)
Alkaline Phosphatase: 39 U/L (ref 39–117)
Bilirubin, Direct: 0 mg/dL (ref 0.0–0.3)
Total Bilirubin: 0.5 mg/dL (ref 0.3–1.2)
Total Protein: 7.3 g/dL (ref 6.0–8.3)

## 2012-05-31 LAB — TSH: TSH: 0.43 u[IU]/mL (ref 0.35–5.50)

## 2012-05-31 NOTE — Patient Instructions (Addendum)
I suspect the left-sided chest discomfort may be musculoskeletal or chest wall related. However followup if persistent and progressive or other concerns. Your EKG is normal.  Continue or intensify healthy lifestyle diet and exercise. If you are doing well check in a year. Have pharmacy contact us for refills.  You have a contracture of your finger tendon. Someone will contact you about a hand surgery consult. Check into when you are due for a colonoscopy. Will notify you  of labs when available. But can also view in [facility]

## 2012-05-31 NOTE — Progress Notes (Signed)
Chief Complaint  Patient presents with  . Annual Exam    HPI: Patient comes in today for Preventive Health Care visit  No major change in health status since last visit . Some less running exercise  Stress this fall.  [place] ed a bit more xanax but doing well now .  Low level pain left chest  Like a throb  Mild  But noticeable  usually present .  1 /10 or ;less   No assoicated sx ? If stress.     Bent left little finger  With nodules in palm  No injury but plays frequently  Plays  mandolin a viola and double bass  Had skin area removed mohs right arm   Dr Danella Deis and referral  ROS:  GEN/ HEENT: No fever, significant weight changes sweats headaches vision problems hearing changes, CV/ PULM; No  shortness of breath cough, syncope,edema  change in exercise tolerance. GI /GU: No adominal pain, vomiting, change in bowel habits. No blood in the stool. No significant GU symptoms. SKIN/HEME: ,no acute skin rashes suspicious lesions or bleeding. No lymphadenopathy, nodules, masses.  NEURO/ PSYCH:  No neurologic signs such as weakness numbness. No depression anxiety. IMM/ Allergy: No unusual infections.  Allergy .   REST of 12 system review negative except as per HPI   Past Medical History  Diagnosis Date  . Trans-sexualism with unspecified sexual history   . History of chickenpox   . Renal stone     ?  Marland Kitchen Heart murmur     no eval ? functional  . Fracture     arm in HS  . LIVER FUNCTION TESTS, ABNORMAL 04/04/2008    Qualifier: Diagnosis of  By: Fabian Sharp MD, Neta Mends Poss related to intense exercise    . Hx of Moh's micrographic surgery for skin cancer 2013    r arm     Family History  Problem Relation Age of Onset  . Alcohol abuse Father     died 55  . Other Brother     overweight    History   Social History  . Marital Status: Single    Spouse Name: N/A    Number of Children: N/A  . Years of Education: N/A   Social History Main Topics  . Smoking status: Never Smoker   .  Smokeless tobacco: None  . Alcohol Use: Yes     Comment: rare to none now  . Drug Use: No  . Sexually Active:    Other Topics Concern  . 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 Plays musical instruments Neg tad    Outpatient Encounter Prescriptions as of 05/31/2012  Medication Sig Dispense Refill  . ALPRAZolam (XANAX) 0.25 MG tablet Take 1 tablet (0.25 mg total) by mouth at bedtime as needed for anxiety. Take 1 tab every 4-6 hours as needed  30 tablet  0  . estradiol (CLIMARA - DOSED IN MG/24 HR) 0.1 mg/24hr APPLY 1 PATCH WEEKLY  4 patch  2  . sertraline (ZOLOFT) 100 MG tablet TAKE 1 & 1/2 TABLET BY MOUTH DAILY  45 tablet  4    EXAM:  BP 118/86  Pulse 82  Temp 98 F (36.7 C) (Oral)  Ht 5' 9.25" (1.759 m)  Wt 172 lb (78.019 kg)  BMI 25.22 kg/m2  SpO2 98%  Body mass index is 25.22 kg/(m^2).  Physical Exam: Vital signs reviewed ZOX:WRUE is a well-developed well-nourished alert cooperative   female who appears her stated age in no acute distress.  HEENT: normocephalic atraumatic , Eyes: PERRL EOM's full, conjunctiva clear, Nares: paten,t no deformity discharge or tenderness., Ears: no deformity EAC's clear TMs with normal landmarks. Mouth: clear OP, no lesions, edema.  Moist mucous membranes. Dentition in adequate repair. NECK: supple without masses, thyromegaly or bruits. CHEST/PULM:  Clear to auscultation and percussion breath sounds equal no wheeze , rales or rhonchi. No chest wall deformities or tenderness. Breast: normal by inspection . No dimpling, discharge, masses, tenderness or discharge . ? If leater rib cp area of discomfort  No lesion noted CV: PMI is nondisplaced, S1 S2 no gallops, murmurs, rubs.  Peripheral pulses are full without delay.No JVD .  ABDOMEN: Bowel sounds normal nontender  No guard or rebound, no hepato splenomegal no CVA tenderness.  No hernia. Extremtities:  No clubbing cyanosis or edema, no acute joint swelling or redness no focal atrophy left hand with nodule  on palm and mild flexion contracture  NEURO:  Oriented x3, cranial nerves 3-12 appear to be intact, no obvious focal weakness,gait within normal limits no abnormal reflexes or asymmetrical SKIN: No acute rashes normal turgor, color, no bruising or petechiae. Healed scar righ forearm  PSYCH: Oriented, good eye contact, no obvious depression anxiety, cognition and judgment appear normal. LN: no cervical axillary inguinal adenopathy Rectal no masses    Lab Results  Component Value Date   WBC 5.6 11/12/2010   HGB 14.4 11/12/2010   HCT 42.4 11/12/2010   PLT 261.0 11/12/2010   GLUCOSE 85 11/12/2010   CHOL 292* 11/12/2010   TRIG 115.0 11/12/2010   HDL 74.90 11/12/2010   LDLDIRECT 168.9 11/12/2010   ALT 28 11/12/2010   AST 32 11/12/2010   NA 141 11/12/2010   K 4.5 11/12/2010   CL 102 11/12/2010   CREATININE 0.9 11/12/2010   BUN 15 11/12/2010   CO2 32 11/12/2010   TSH 0.89 11/12/2010   ekg normal  ASSESSMENT AND PLAN:  Discussed the following assessment and plan:  1. Visit for preventive health examination  Basic metabolic panel, CBC with Differential, Hepatic function panel, Lipid panel, TSH  2. Need for prophylactic vaccination and inoculation against influenza  Basic metabolic panel, CBC with Differential, Hepatic function panel, Lipid panel, TSH  3. Chest discomfort  Basic metabolic panel, CBC with Differential, Hepatic function panel, Lipid panel, TSH, EKG 12-Lead   left poss chest wall   4. Other and unspecified hyperlipidemia  Basic metabolic panel, CBC with Differential, Hepatic function panel, Lipid panel, TSH   hx of same  has been elevated in past  nef fam hx of cvd   5. Contracture of finger joint  Basic  metabolic panel, CBC with Differential, Hepatic function panel, Lipid panel, TSH, Ambulatory referral to Hand Surgery   left pinly will get hand surgeon to opine.  6. ADJ DISORDER WITH MIXED ANXIETY & DEPRESSED MOOD     stable on meds will continue fu yearly earlier if problems    Patient Care Team: Madelin Headings, MD as PCP - General Hope Bjorn Loser, MD as Attending Physician (Dermatology) Patient Instructions  I suspect the left-sided chest discomfort may be musculoskeletal or chest wall related. However followup if persistent and progressive or other concerns.  Your EKG is normal.  Continue or intensify healthy lifestyle diet and exercise. If you are doing well check in a year. Have pharmacy contact us for refills.  You have a contracture of your finger tendon. Someone will contact you about a hand surgery consult. Check into when you are due for a colonoscopy. Will notify you  of labs when available. But can also view in [facility] K. Bentlee Drier M.D.

## 2012-06-01 LAB — LDL CHOLESTEROL, DIRECT: Direct LDL: 174.7 mg/dL

## 2012-06-02 ENCOUNTER — Encounter: Payer: Self-pay | Admitting: Internal Medicine

## 2012-06-11 ENCOUNTER — Encounter: Payer: Self-pay | Admitting: Family Medicine

## 2012-06-14 ENCOUNTER — Telehealth: Payer: Self-pay | Admitting: Family Medicine

## 2012-06-14 NOTE — Telephone Encounter (Signed)
The patient called and left a message on my machine.  She stated that she has not felt good the past few days.  She has been using Sudafed 30mg , ibuprofen 400mg , the highest dose of Mucinex every 12 hours, salt water gargle and netty pot.  I called her back for more information.  Says this started last week.  She has a sore throat, hoarseness, wheezing, ear pain, cough and no fever.  She said she was feeling better than she did yesterday.  Per WP, okay to call in cough medication and albuterol inhaler.  The patient decided that she would like to wait and see how she feels tomorrow or the day after before starting any medication.  I instructed her to call back and leave a message on machine if I was unable to answer the phone.

## 2012-06-15 ENCOUNTER — Other Ambulatory Visit: Payer: Self-pay | Admitting: Family Medicine

## 2012-06-15 MED ORDER — HYDROCODONE-HOMATROPINE 5-1.5 MG/5ML PO SYRP: 5.0000 mL | ORAL_SOLUTION | Freq: Four times a day (QID) | ORAL | Status: DC | PRN

## 2012-06-15 MED ORDER — ALBUTEROL SULFATE HFA 108 (90 BASE) MCG/ACT IN AERS: 2.0000 | INHALATION_SPRAY | Freq: Four times a day (QID) | RESPIRATORY_TRACT | Status: DC | PRN

## 2012-06-16 ENCOUNTER — Other Ambulatory Visit: Payer: Self-pay | Admitting: Internal Medicine

## 2012-07-05 ENCOUNTER — Telehealth: Payer: Self-pay | Admitting: Family Medicine

## 2012-07-05 NOTE — Telephone Encounter (Signed)
The patient called and left a message on my voicemail.  She continues to have a dry cough that is irritating.  Need to find out more information.  Left a message on her machine to call me back.

## 2012-07-06 NOTE — Telephone Encounter (Signed)
Left a message on home/cell for the pt to return my call. 

## 2012-07-07 NOTE — Telephone Encounter (Signed)
Left message on home/cell for the pt to return my call.  Cannot reach the pt.  Have tried numerous times.  Left a message for the pt to return my call if I could be of any further help for her.

## 2012-08-30 ENCOUNTER — Telehealth: Payer: Self-pay | Admitting: Family Medicine

## 2012-08-30 NOTE — Telephone Encounter (Signed)
Last seen 05/31/12 Last filled: 04/22/12 Follow up: 06/06/13 Please advise. Thanks!!

## 2012-08-30 NOTE — Telephone Encounter (Signed)
Ok to refill x 1  

## 2012-08-31 ENCOUNTER — Other Ambulatory Visit: Payer: Self-pay | Admitting: Family Medicine

## 2012-08-31 MED ORDER — ALPRAZOLAM 0.25 MG PO TABS: 0.2500 mg | ORAL_TABLET | Freq: Every evening | ORAL | Status: DC | PRN

## 2012-08-31 NOTE — Telephone Encounter (Signed)
Called and left on voicemail. 

## 2012-11-05 ENCOUNTER — Other Ambulatory Visit: Payer: Self-pay | Admitting: Family Medicine

## 2012-11-05 ENCOUNTER — Telehealth: Payer: Self-pay | Admitting: Family Medicine

## 2012-11-05 MED ORDER — ALPRAZOLAM 0.25 MG PO TABS: 0.2500 mg | ORAL_TABLET | Freq: Every evening | ORAL | Status: DC | PRN

## 2012-11-05 NOTE — Telephone Encounter (Signed)
Called to the pharmacy and left on voicemail. 

## 2012-11-05 NOTE — Telephone Encounter (Signed)
Pt called and left message on my machine.  Needs refills of alprazolam. Last seen on 05/31/12 Last filled on 08/31/12 #30 with 0 additional refills Has upcoming appt on 06/06/13.

## 2012-11-05 NOTE — Telephone Encounter (Signed)
Ok x 1

## 2012-11-29 ENCOUNTER — Other Ambulatory Visit: Payer: Self-pay | Admitting: Internal Medicine

## 2012-12-31 ENCOUNTER — Telehealth: Payer: Self-pay | Admitting: Family Medicine

## 2012-12-31 NOTE — Telephone Encounter (Signed)
Last filled on 11/05/12 #30 with 0 additional refills Last seen on 05/31/12 Has a future appt on 06/06/13 Please advise.  Thanks!

## 2013-01-03 NOTE — Telephone Encounter (Signed)
Ok x 1

## 2013-01-04 ENCOUNTER — Other Ambulatory Visit: Payer: Self-pay | Admitting: Family Medicine

## 2013-01-04 MED ORDER — ALPRAZOLAM 0.25 MG PO TABS: 0.2500 mg | ORAL_TABLET | Freq: Every evening | ORAL | Status: DC | PRN

## 2013-01-04 NOTE — Telephone Encounter (Signed)
Called to the pharmacy and left on voicemail. 

## 2013-02-25 ENCOUNTER — Telehealth: Payer: Self-pay | Admitting: Family Medicine

## 2013-02-25 ENCOUNTER — Other Ambulatory Visit: Payer: Self-pay | Admitting: Family Medicine

## 2013-02-25 MED ORDER — ALPRAZOLAM 0.25 MG PO TABS: 0.2500 mg | ORAL_TABLET | Freq: Every evening | ORAL | Status: DC | PRN

## 2013-02-25 NOTE — Telephone Encounter (Signed)
Last filled on 01/04/13 #30 with 0 additional refills Has a future appointment on 06/06/13. Last seen on 05/31/12. Please advise.  Thanks!

## 2013-02-25 NOTE — Telephone Encounter (Signed)
Ok x 1

## 2013-02-25 NOTE — Telephone Encounter (Signed)
Called and left on voicemail. 

## 2013-04-28 ENCOUNTER — Other Ambulatory Visit: Payer: Self-pay | Admitting: Family Medicine

## 2013-04-28 ENCOUNTER — Telehealth: Payer: Self-pay | Admitting: Family Medicine

## 2013-04-28 MED ORDER — ALPRAZOLAM 0.25 MG PO TABS: 0.2500 mg | ORAL_TABLET | Freq: Every evening | ORAL | Status: DC | PRN

## 2013-04-28 NOTE — Telephone Encounter (Signed)
Last filled on 02/25/13 #30 with 0 additional refills Has a future appointment on 06/06/13 Last seen on 05/21/12 Please advise. Thanks!

## 2013-04-28 NOTE — Telephone Encounter (Signed)
Medication called and left on voicemail.

## 2013-04-28 NOTE — Telephone Encounter (Signed)
Ok x 1

## 2013-05-26 ENCOUNTER — Other Ambulatory Visit: Payer: Self-pay | Admitting: Internal Medicine

## 2013-06-03 ENCOUNTER — Encounter: Payer: Self-pay | Admitting: *Deleted

## 2013-06-06 ENCOUNTER — Ambulatory Visit (INDEPENDENT_AMBULATORY_CARE_PROVIDER_SITE_OTHER): Payer: BC Managed Care – PPO | Admitting: Internal Medicine

## 2013-06-06 ENCOUNTER — Encounter: Payer: Self-pay | Admitting: Internal Medicine

## 2013-06-06 VITALS — BP 120/84 | HR 64 | Temp 98.4°F | Ht 69.0 in | Wt 180.0 lb

## 2013-06-06 DIAGNOSIS — M720 Palmar fascial fibromatosis [Dupuytren]: Secondary | ICD-10-CM

## 2013-06-06 DIAGNOSIS — M24549 Contracture, unspecified hand: Secondary | ICD-10-CM

## 2013-06-06 DIAGNOSIS — G479 Sleep disorder, unspecified: Secondary | ICD-10-CM

## 2013-06-06 DIAGNOSIS — F4323 Adjustment disorder with mixed anxiety and depressed mood: Secondary | ICD-10-CM

## 2013-06-06 DIAGNOSIS — Z634 Disappearance and death of family member: Secondary | ICD-10-CM

## 2013-06-06 DIAGNOSIS — Z0000 Encounter for general adult medical examination without abnormal findings: Secondary | ICD-10-CM

## 2013-06-06 DIAGNOSIS — E785 Hyperlipidemia, unspecified: Secondary | ICD-10-CM

## 2013-06-06 LAB — HEPATIC FUNCTION PANEL
ALK PHOS: 47 U/L (ref 39–117)
ALT: 35 U/L (ref 0–35)
AST: 35 U/L (ref 0–37)
Albumin: 4.6 g/dL (ref 3.5–5.2)
BILIRUBIN DIRECT: 0 mg/dL (ref 0.0–0.3)
BILIRUBIN TOTAL: 0.9 mg/dL (ref 0.3–1.2)
TOTAL PROTEIN: 8 g/dL (ref 6.0–8.3)

## 2013-06-06 LAB — CBC WITH DIFFERENTIAL/PLATELET
BASOS PCT: 0.7 % (ref 0.0–3.0)
Basophils Absolute: 0 10*3/uL (ref 0.0–0.1)
EOS ABS: 0 10*3/uL (ref 0.0–0.7)
EOS PCT: 0.8 % (ref 0.0–5.0)
HCT: 41.4 % (ref 36.0–46.0)
Hemoglobin: 13.9 g/dL (ref 12.0–15.0)
LYMPHS PCT: 23.5 % (ref 12.0–46.0)
Lymphs Abs: 1.3 10*3/uL (ref 0.7–4.0)
MCHC: 33.7 g/dL (ref 30.0–36.0)
MCV: 89.6 fl (ref 78.0–100.0)
Monocytes Absolute: 0.4 10*3/uL (ref 0.1–1.0)
Monocytes Relative: 7.2 % (ref 3.0–12.0)
NEUTROS PCT: 67.8 % (ref 43.0–77.0)
Neutro Abs: 3.7 10*3/uL (ref 1.4–7.7)
PLATELETS: 252 10*3/uL (ref 150.0–400.0)
RBC: 4.62 Mil/uL (ref 3.87–5.11)
RDW: 14.3 % (ref 11.5–14.6)
WBC: 5.4 10*3/uL (ref 4.5–10.5)

## 2013-06-06 LAB — TSH: TSH: 0.9 u[IU]/mL (ref 0.35–5.50)

## 2013-06-06 LAB — LIPID PANEL
CHOL/HDL RATIO: 3
Cholesterol: 233 mg/dL — ABNORMAL HIGH (ref 0–200)
HDL: 72.8 mg/dL (ref >39.00)
Triglycerides: 94 mg/dL (ref 0.0–149.0)
VLDL: 18.8 mg/dL (ref 0.0–40.0)

## 2013-06-06 LAB — BASIC METABOLIC PANEL
BUN: 19 mg/dL (ref 6–23)
CALCIUM: 9.4 mg/dL (ref 8.4–10.5)
CO2: 28 mEq/L (ref 19–32)
CREATININE: 1.1 mg/dL (ref 0.4–1.2)
Chloride: 103 mEq/L (ref 96–112)
GFR: 55.08 mL/min — ABNORMAL LOW (ref >60.00)
Glucose, Bld: 90 mg/dL (ref 70–99)
Potassium: 4.6 mEq/L (ref 3.5–5.1)
Sodium: 139 mEq/L (ref 135–145)

## 2013-06-06 LAB — LDL CHOLESTEROL, DIRECT: Direct LDL: 140.6 mg/dL

## 2013-06-06 MED ORDER — BUPROPION HCL ER (XL) 150 MG PO TB24: 150.0000 mg | ORAL_TABLET | Freq: Every day | ORAL | Status: DC

## 2013-06-06 NOTE — Progress Notes (Signed)
Chief Complaint  Patient presents with  . Annual Exam    HPI: Patient comes in today for Preventive Health Care visit  No major change in health status since last visit . No new meds  hisp ed visit.  [address]  4 years.   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 Dog and cat.  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  Sertraline  ? Change to wellbutrin   Because  [place]  Dec libido. Anxiety situational  And takes 1/2 tab of aprazolam.  As needed  Plays violin   Health Maintenance  Topic Date Due  . Mammogram  10/31/2012  . Colonoscopy  04/18/2013  . Pap Smear  10/31/2013  . Influenza Vaccine  12/17/2013  . Tetanus/tdap  11/18/2020  . Zostavax  Completed   Health Maintenance Review   ROS:  dupytren contracture left  GEN/ HEENT: No fever, significant weight changes sweats headaches vision problems hearing changes, CV/ PULM; No chest pain shortness of breath cough, syncope,edema  change in exercise tolerance. GI /GU: No adominal pain, vomiting, change in bowel habits. No blood in the stool. No significant GU symptoms. SKIN/HEME: ,no acute skin rashes suspicious lesions or bleeding. No lymphadenopathy, nodules, masses.  NEURO/ PSYCH:  No neurologic signs such as weakness numbness. No dep anxiety situational now  Left dup contracture nodule left foot non painful IMM/ Allergy: No unusual infections.  Allergy .   REST of 12 system review negative except as per HPI   Past Medical History  Diagnosis Date  . Trans-sexualism with unspecified sexual history   . History of chickenpox   . Renal stone     ?  Marland Kitchen Heart murmur     no eval ? functional  . Fracture     arm in HS  . LIVER FUNCTION TESTS, ABNORMAL 04/04/2008    Qualifier: Diagnosis of  By: Fabian Sharp MD, Neta Mends Poss related to intense exercise    . Hx of Moh's micrographic surgery for skin  cancer 2013    r arm     Family History  Problem Relation Age of Onset  . Alcohol abuse Father     died 58  . Other Brother     overweight    History   Social History  . Marital Status: Single    Spouse Name: N/A    Number of Children: N/A  . Years of Education: N/A   Social History Main Topics  . Smoking status: Never Smoker   . Smokeless tobacco: None  . Alcohol Use: Yes     Comment: rare to none now  . Drug Use: No  . Sexual Activity:    Other Topics Concern  . 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    Plays musical instruments violin   Neg tad                   Outpatient  Encounter Prescriptions as of 06/06/2013  Medication Sig  . ALPRAZolam (XANAX) 0.25 MG tablet Take 1 tablet (0.25 mg total) by mouth at bedtime as needed for anxiety. Take 1 tab every 4-6 hours as needed  . estradiol (CLIMARA - DOSED IN MG/24 HR) 0.1 mg/24hr patch APPLY 1 PATCH WEEKLY TOPICALLY  . sertraline (ZOLOFT) 100 MG tablet TAKE 1 AND 1/2 TABLETS BYMOUTH DAILY  . buPROPion (WELLBUTRIN XL) 150 MG 24 hr tablet Take 1 tablet (150 mg total) by mouth daily. Increase  To 2 per day after 3-4 weeks or as directed  . [DISCONTINUED] albuterol (PROVENTIL HFA;VENTOLIN HFA) 108 (90 BASE) MCG/ACT inhaler Inhale 2 puffs into the lungs every 6 (six) hours as needed for wheezing.  . [DISCONTINUED] HYDROcodone-homatropine (HYCODAN) 5-1.5 MG/5ML syrup Take 5 mLs by mouth every 6 (six) hours as needed for cough.    EXAM:  BP 120/84  Pulse 64  Temp(Src) 98.4 F (36.9 C) (Oral)  Ht 5\' 9"  (1.753 m)  Wt 180 lb (81.647 kg)  BMI 26.57 kg/m2  SpO2 99%  Body mass index is 26.57 kg/(m^2).  Physical Exam: Vital signs  reviewed [id number] is a well-developed well-nourished alert cooperative   female who appears her stated age in no acute distress.  HEENT: normocephalic atraumatic , Eyes: PERRL EOM's full, conjunctiva clear, Nares: paten,t no deformity discharge or tenderness., Ears: no deformity EAC's clear TMs with normal landmarks. Mouth: clear OP, no lesions, edema.  Moist mucous membranes. Dentition in adequate repair. NECK: supple without masses, thyromegaly or bruits. CHEST/PULM:  Clear to auscultation and percussion breath sounds equal no wheeze , rales or rhonchi. No chest wall deformities or tenderness. Breast: normal by inspection . No dimpling, discharge, masses, tenderness or discharge . CV: PMI is nondisplaced, S1 S2 no gallops, murmurs, rubs. Peripheral pulses are full without delay.No JVD .  ABDOMEN: Bowel sounds normal nontender  No guard or rebound, no hepato splenomegal no CVA tenderness.   Extremtities:  No clubbing cyanosis or edema, no acute joint swelling or redness no focal atrophy left pniky dupy contracture  Left sole foot with small cyst non tender arch area  NEURO:  Oriented x3, cranial nerves 3-12 appear to be intact, no obvious focal weakness,gait within normal limits no abnormal reflexes or asymmetrical SKIN: No acute rashes normal turgor, color, no bruising or petechiae. PSYCH: Oriented, good eye contact, no obvious depression anxiety, cognition and judgment appear normal. LN: no cervical axillary inguinal adenopathy  Lab Results  Component Value Date   WBC 5.4 06/06/2013   HGB 13.9 06/06/2013   HCT 41.4 06/06/2013   PLT 252.0 06/06/2013   GLUCOSE 90 06/06/2013   CHOL 233* 06/06/2013   TRIG 94.0 06/06/2013   HDL 72.80 06/06/2013   LDLDIRECT 140.6 06/06/2013   ALT 35 06/06/2013   AST 35 06/06/2013   NA 139 06/06/2013   K 4.6 06/06/2013   CL 103 06/06/2013   CREATININE 1.1 06/06/2013   BUN 19 06/06/2013   CO2 28 06/06/2013   TSH 0.90 06/06/2013    ASSESSMENT AND PLAN:  Discussed the  following assessment and plan:  Visit for preventive health examination - get mammo colon  - Plan: Basic metabolic panel, CBC with Differential, Hepatic function panel, Lipid panel, TSH  Other and unspecified hyperlipidemia - hogh last year diet changes neg fam hx premature cv disease - Plan: Basic metabolic panel, CBC with Differential, Hepatic function panel, Lipid panel, TSH  ADJ DISORDER WITH MIXED ANXIETY & DEPRESSED MOOD - better ok  to switch dec zoloft and add wellbutrin and fu  - Plan: Basic metabolic panel, CBC with Differential, Hepatic function panel, Lipid panel, TSH  Contracture of finger joint - dupycontractleft pinky  following at this time splint etc   Sleep disturbance - habit and situational?  Dupuytren contracture  Uncomplicated bereavement  Patient Care Team: Madelin HeadingsWanda K Rafe Mackowski, MD as PCP - General Hope Bjorn LoserMarsha Gruber, MD as Attending Physician (Dermatology) Patient Instructions  Continue lifestyle intervention healthy eating and exercise . Decrease  zoloft to 100 mg per day and then  50 mg per day   Over 2-4 weeks  when stable  Add wellbutrin 150 xr qd  After a month can decrease to 25 of zoloft and increase wellbutrin xr 300 mg per cday 2 150/  rov in 2 months  Or as needed. Limit x anax  As this is dependent producing medication. Optimize sleep hygiene Some people get help with melatonin 3 hours pre sleep time.   Get a colonoscopy  And get us results. Contact us if we need to help . Get a mammogram.    Insomnia Insomnia is frequent trouble falling and/or staying asleep. Insomnia can be a long term problem or a short term problem. Both are common. Insomnia can be a short term problem when the wakefulness is related to a certain stress or worry. Long term insomnia is often related to ongoing stress during waking hours and/or poor sleeping habits. Overtime, sleep deprivation itself can make the problem worse. Every little thing feels more severe because you are  overtired and your ability to cope is decreased. CAUSES   Stress, anxiety, and depression.  Poor sleeping habits.  Distractions such as TV in the bedroom.  Naps close to bedtime.  Engaging in emotionally charged conversations before bed.  Technical reading before sleep.  Alcohol and other sedatives. They may make the problem worse. They can hurt normal sleep patterns and normal dream activity.  Stimulants such as caffeine for several hours prior to bedtime.  Pain syndromes and shortness of breath can cause insomnia.  Exercise late at night.  Changing time zones may cause sleeping problems (jet lag). It is sometimes helpful to have someone observe your sleeping patterns. They should look for periods of not breathing during the night (sleep apnea). They should also look to see how long those periods last. If you live alone or observers are uncertain, you can also be observed at a sleep clinic where your sleep patterns will be professionally monitored. Sleep apnea requires a checkup and treatment. Give your caregivers your medical history. Give your caregivers observations your family has made about your sleep.  SYMPTOMS   Not feeling rested in the morning.  Anxiety and restlessness at bedtime.  Difficulty falling and staying asleep. TREATMENT   Your caregiver may prescribe treatment for an underlying medical disorders. Your caregiver can give advice or help if you are using alcohol or other drugs for self-medication. Treatment of underlying problems will usually eliminate insomnia problems.  Medications can be prescribed for short time use. They are generally not recommended for lengthy use.  Over-the-counter sleep medicines are not recommended for lengthy use. They can be habit forming.  You can promote easier sleeping by making lifestyle changes such as:  Using relaxation techniques that help with breathing and reduce muscle tension.  Exercising earlier in the  day.  Changing your diet and the time of your last meal. No night time snacks.  Establish a regular time to go to  bed.  Counseling can help with stressful problems and worry.  Soothing music and white noise may be helpful if there are background noises you cannot remove.  Stop tedious detailed work at least one hour before bedtime. HOME CARE INSTRUCTIONS   Keep a diary. Inform your caregiver about your progress. This includes any medication side effects. See your caregiver regularly. Take note of:  Times when you are asleep.  Times when you are awake during the night.  The quality of your sleep.  How you feel the next day. This information will help your caregiver care for you.  Get out of bed if you are still awake after 15 minutes. Read or do some quiet activity. Keep the lights down. Wait until you feel sleepy and go back to bed.  Keep regular sleeping and waking hours. Avoid naps.  Exercise regularly.  Avoid distractions at bedtime. Distractions include watching television or engaging in any intense or detailed activity like attempting to balance the household checkbook.  Develop a bedtime ritual. Keep a familiar routine of bathing, brushing your teeth, climbing into bed at the same time each night, listening to soothing music. Routines increase the success of falling to sleep faster.  Use relaxation techniques. This can be using breathing and muscle tension release routines. It can also include visualizing peaceful scenes. You can also help control troubling or intruding thoughts by keeping your mind occupied with boring or repetitive thoughts like the old concept of counting sheep. You can make it more creative like imagining planting one beautiful flower after another in your backyard garden.  During your day, work to eliminate stress. When this is not possible use some of the previous suggestions to help reduce the anxiety that accompanies stressful situations. MAKE SURE  YOU:   Understand these instructions.  Will watch your condition.  Will get help right away if you are not doing well or get worse. Document Released: 05/02/2000 Document Revised: 07/28/2011 Document Reviewed: 06/02/2007 [facility] Patient Information 2014 [place], [place].     Neta Mends. Tayva Easterday M.D.

## 2013-06-06 NOTE — Progress Notes (Signed)
Pre visit review using our clinic review tool, if applicable. No additional management support is needed unless otherwise documented below in the visit note. 

## 2013-06-06 NOTE — Patient Instructions (Addendum)
Continue lifestyle intervention healthy eating and exercise . Decrease  zoloft to 100 mg per day and then  50 mg per day   Over 2-4 weeks  when stable  Add wellbutrin 150 xr qd  After a month can decrease to 25 of zoloft and increase wellbutrin xr 300 mg per cday 2 150/  rov in 2 months  Or as needed. Limit x anax  As this is dependent producing medication. Optimize sleep hygiene Some people get help with melatonin 3 hours pre sleep time.   Get a colonoscopy  And get us results. Contact us if we need to help . Get a mammogram.    Insomnia Insomnia is frequent trouble falling and/or staying asleep. Insomnia can be a long term problem or a short term problem. Both are common. Insomnia can be a short term problem when the wakefulness is related to a certain stress or worry. Long term insomnia is often related to ongoing stress during waking hours and/or poor sleeping habits. Overtime, sleep deprivation itself can make the problem worse. Every little thing feels more severe because you are overtired and your ability to cope is decreased. CAUSES   Stress, anxiety, and depression.  Poor sleeping habits.  Distractions such as TV in the bedroom.  Naps close to bedtime.  Engaging in emotionally charged conversations before bed.  Technical reading before sleep.  Alcohol and other sedatives. They may make the problem worse. They can hurt normal sleep patterns and normal dream activity.  Stimulants such as caffeine for several hours prior to bedtime.  Pain syndromes and shortness of breath can cause insomnia.  Exercise late at night.  Changing time zones may cause sleeping problems (jet lag). It is sometimes helpful to have someone observe your sleeping patterns. They should look for periods of not breathing during the night (sleep apnea). They should also look to see how long those periods last. If you live alone or observers are uncertain, you can also be observed at a sleep clinic where  your sleep patterns will be professionally monitored. Sleep apnea requires a checkup and treatment. Give your caregivers your medical history. Give your caregivers observations your family has made about your sleep.  SYMPTOMS   Not feeling rested in the morning.  Anxiety and restlessness at bedtime.  Difficulty falling and staying asleep. TREATMENT   Your caregiver may prescribe treatment for an underlying medical disorders. Your caregiver can give advice or help if you are using alcohol or other drugs for self-medication. Treatment of underlying problems will usually eliminate insomnia problems.  Medications can be prescribed for short time use. They are generally not recommended for lengthy use.  Over-the-counter sleep medicines are not recommended for lengthy use. They can be habit forming.  You can promote easier sleeping by making lifestyle changes such as:  Using relaxation techniques that help with breathing and reduce muscle tension.  Exercising earlier in the day.  Changing your diet and the time of your last meal. No night time snacks.  Establish a regular time to go to bed.  Counseling can help with stressful problems and worry.  Soothing music and white noise may be helpful if there are background noises you cannot remove.  Stop tedious detailed work at least one hour before bedtime. HOME CARE INSTRUCTIONS   Keep a diary. Inform your caregiver about your progress. This includes any medication side effects. See your caregiver regularly. Take note of:  Times when you are asleep.  Times when you are awake  during the night.  The quality of your sleep.  How you feel the next day. This information will help your caregiver care for you.  Get out of bed if you are still awake after 15 minutes. Read or do some quiet activity. Keep the lights down. Wait until you feel sleepy and go back to bed.  Keep regular sleeping and waking hours. Avoid naps.  Exercise  regularly.  Avoid distractions at bedtime. Distractions include watching television or engaging in any intense or detailed activity like attempting to balance the household checkbook.  Develop a bedtime ritual. Keep a familiar routine of bathing, brushing your teeth, climbing into bed at the same time each night, listening to soothing music. Routines increase the success of falling to sleep faster.  Use relaxation techniques. This can be using breathing and muscle tension release routines. It can also include visualizing peaceful scenes. You can also help control troubling or intruding thoughts by keeping your mind occupied with boring or repetitive thoughts like the old concept of counting sheep. You can make it more creative like imagining planting one beautiful flower after another in your backyard garden.  During your day, work to eliminate stress. When this is not possible use some of the previous suggestions to help reduce the anxiety that accompanies stressful situations. MAKE SURE YOU:   Understand these instructions.  Will watch your condition.  Will get help right away if you are not doing well or get worse. Document Released: 05/02/2000 Document Revised: 07/28/2011 Document Reviewed: 06/02/2007 [facility] Patient Information 2014 [place], [place].

## 2013-06-10 ENCOUNTER — Encounter: Payer: Self-pay | Admitting: Family Medicine

## 2013-06-30 ENCOUNTER — Encounter: Payer: Self-pay | Admitting: Internal Medicine

## 2013-07-01 ENCOUNTER — Other Ambulatory Visit: Payer: Self-pay | Admitting: Internal Medicine

## 2013-08-01 ENCOUNTER — Ambulatory Visit (INDEPENDENT_AMBULATORY_CARE_PROVIDER_SITE_OTHER): Payer: BC Managed Care – PPO | Admitting: Internal Medicine

## 2013-08-01 ENCOUNTER — Encounter: Payer: Self-pay | Admitting: Internal Medicine

## 2013-08-01 ENCOUNTER — Ambulatory Visit: Payer: BC Managed Care – PPO | Admitting: Internal Medicine

## 2013-08-01 VITALS — BP 120/88 | Temp 98.2°F | Ht 69.0 in | Wt 180.0 lb

## 2013-08-01 DIAGNOSIS — G479 Sleep disorder, unspecified: Secondary | ICD-10-CM

## 2013-08-01 DIAGNOSIS — Z79899 Other long term (current) drug therapy: Secondary | ICD-10-CM

## 2013-08-01 DIAGNOSIS — E785 Hyperlipidemia, unspecified: Secondary | ICD-10-CM

## 2013-08-01 DIAGNOSIS — F4323 Adjustment disorder with mixed anxiety and depressed mood: Secondary | ICD-10-CM

## 2013-08-01 MED ORDER — BUPROPION HCL ER (XL) 150 MG PO TB24
300.0000 mg | ORAL_TABLET | Freq: Every day | ORAL | Status: DC
Start: 2013-08-01 — End: 2013-12-14

## 2013-08-01 NOTE — Patient Instructions (Signed)
Continue lifestyle intervention healthy eating and exercise . Cholesterol is better.  Stay on the wellbutrin .  2 150 per day.  Get a colonoscopy.  rechecka t your next preventive  Visit .

## 2013-08-01 NOTE — Progress Notes (Signed)
Chief Complaint  Patient presents with  . Follow-up    HPI: Fu of med management.  wellbutrin now "seems to be working well"   Last week zoloft 25 mg  Off all together now . Marland Kitchen Taking 2 150 .  So spitting. Dose  Am and then early mid afternoon.  Less jittery  Stopped xanax  And had some edginess for a few day. And now ok  After discussion last time.  No meds for sleep . ROS: See pertinent positives and negatives per HPI. Beginning to exercise more   Past Medical History  Diagnosis Date  . Trans-sexualism with unspecified sexual history   . History of chickenpox   . Renal stone     ?  Marland Kitchen Heart murmur     no eval ? functional  . Fracture     arm in HS  . LIVER FUNCTION TESTS, ABNORMAL 04/04/2008    Qualifier: Diagnosis of  By: Fabian Sharp MD, Neta Mends Poss related to intense exercise    . Hx of Moh's micrographic surgery for skin cancer 2013    r arm     Family History  Problem Relation Age of Onset  . Alcohol abuse Father     died 41  . Other Brother     overweight    History   Social History  . Marital Status: Single    Spouse Name: N/A    Number of Children: N/A  . Years of Education: N/A   Social History Main Topics  . Smoking status: Never Smoker   . Smokeless tobacco: None  . Alcohol Use: Yes     Comment: rare to none now  . Drug Use: No  . Sexual Activity:    Other Topics Concern  . 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    Plays musical instruments violin   Neg tad                   Outpatient Encounter Prescriptions as of 08/01/2013  Medication Sig  . buPROPion (WELLBUTRIN XL) 150 MG 24 hr tablet Take 2 tablets (300 mg total) by mouth daily. or as directed  . estradiol  (CLIMARA - DOSED IN MG/24 HR) 0.1 mg/24hr patch APPLY 1 PATCH WEEKLY TOPICALLY  . [DISCONTINUED] buPROPion (WELLBUTRIN XL) 150 MG 24 hr tablet Take 1 tablet (150 mg total) by mouth daily. Increase  To 2 per day after 3-4 weeks or as directed  . [DISCONTINUED] ALPRAZolam (XANAX) 0.25 MG tablet Take 1 tablet (0.25 mg total) by mouth at bedtime as needed for anxiety. Take 1 tab every 4-6 hours as needed  . [DISCONTINUED] sertraline (ZOLOFT) 100 MG tablet TAKE 1 AND 1/2 TABLETS BYMOUTH DAILY    EXAM:  BP 120/88  Temp(Src) 98.2 F (36.8 C) (Oral)  Ht 5\' 9"  (1.753 m)  Wt 180 lb (81.647 kg)  BMI 26.57 kg/m2  Body mass index is 26.57 kg/(m^2).  GENERAL: vitals reviewed and listed above, alert, oriented, appears well hydrated and in no acute distress PSYCH: pleasant and cooperative, no obvious depression or anxiety Lab Results  Component Value Date   WBC  5.4 06/06/2013   HGB 13.9 06/06/2013   HCT 41.4 06/06/2013   PLT 252.0 06/06/2013   GLUCOSE 90 06/06/2013   CHOL 233* 06/06/2013   TRIG 94.0 06/06/2013   HDL 72.80 06/06/2013   LDLDIRECT 140.6 06/06/2013   ALT 35 06/06/2013   AST 35 06/06/2013   NA 139 06/06/2013   K 4.6 06/06/2013   CL 103 06/06/2013   CREATININE 1.1 06/06/2013   BUN 19 06/06/2013   CO2 28 06/06/2013   TSH 0.90 06/06/2013   reviewed with pt  ASSESSMENT AND PLAN:  Discussed the following assessment and plan:  ADJ DISORDER WITH MIXED ANXIETY & DEPRESSED MOOD - doing well with switch  no 150 am and mid day xr continue  - Plan: buPROPion (WELLBUTRIN XL) 150 MG 24 hr tablet  Medication management  Other and unspecified hyperlipidemia - hogh last year diet changes neg fam hx premature cv disease - Plan: buPROPion (WELLBUTRIN XL) 150 MG 24 hr tablet  Sleep disturbance - better  off xanax now and doing well iwth  lsi - Plan: buPROPion (WELLBUTRIN XL) 150 MG 24 hr tablet  -Patient advised to return or notify health care team  if symptoms worsen ,persist or new concerns  arise.  Patient Instructions  Continue lifestyle intervention healthy eating and exercise . Cholesterol is better.  Stay on the wellbutrin .  2 150 per day.  Get a colonoscopy.  rechecka t your next preventive  Visit .      Neta MendsWanda K. Emberlynn Riggan M.D.  Pre visit review using our clinic review tool, if applicable. No additional management support is needed unless otherwise documented below in the visit note.

## 2013-12-14 ENCOUNTER — Encounter: Payer: Self-pay | Admitting: Internal Medicine

## 2013-12-14 ENCOUNTER — Ambulatory Visit (INDEPENDENT_AMBULATORY_CARE_PROVIDER_SITE_OTHER): Payer: BC Managed Care – PPO | Admitting: Internal Medicine

## 2013-12-14 VITALS — BP 150/90 | Temp 98.2°F | Ht 69.0 in | Wt 171.0 lb

## 2013-12-14 DIAGNOSIS — M79672 Pain in left foot: Secondary | ICD-10-CM

## 2013-12-14 DIAGNOSIS — IMO0001 Reserved for inherently not codable concepts without codable children: Secondary | ICD-10-CM

## 2013-12-14 DIAGNOSIS — M79609 Pain in unspecified limb: Secondary | ICD-10-CM

## 2013-12-14 DIAGNOSIS — S99922S Unspecified injury of left foot, sequela: Secondary | ICD-10-CM

## 2013-12-14 NOTE — Progress Notes (Signed)
Pre visit review using our clinic review tool, if applicable. No additional management support is needed unless otherwise documented below in the visit note.  Chief Complaint  Patient presents with  . Foot Pain    HPI: Patient comes in today for SDA for  new problem evaluation. Onset  About 2.5 months ago  Had on sandal and tripped and had hyperextension injury to mid foot and ? Ankle  hurt badly and hard to walk for a few days iced  No bruising .  better but still aches and swollen hasn't run since then in case    IRx ice ibuproifen  And still aching and some numbness   . Remote hx of Mirror on wall  Fell and  pierced  throught  Tendon left foot  and had boot   About 14  years ago .   Healed almost totally . Little less stable. ? Residual but not problematic   ROS: See pertinent positives and negatives per HPI.  hasnt checked bp usually has been good.   Past Medical History  Diagnosis Date  . Trans-sexualism with unspecified sexual history   . History of chickenpox   . Renal stone     ?  Marland Kitchen. Heart murmur     no eval ? functional  . Fracture     arm in HS  . LIVER FUNCTION TESTS, ABNORMAL 04/04/2008    Qualifier: Diagnosis of  By: Fabian SharpPanosh MD, Neta MendsWanda K Poss related to intense exercise    . Hx of Moh's micrographic surgery for skin cancer 2013    r arm     Family History  Problem Relation Age of Onset  . Alcohol abuse Father     died [id number]  . Other Brother     overweight    History   Social History  . Marital Status: Single    Spouse Name: N/A    Number of Children: N/A  . Years of Education: N/A   Social History Main Topics  . Smoking status: Never Smoker   . Smokeless tobacco: None  . Alcohol Use: Yes     Comment: rare to none now  . Drug Use: No  . Sexual Activity:    Other Topics Concern  . 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    Plays musical instruments violin   Neg tad                   Outpatient Encounter Prescriptions as of 12/14/2013  Medication Sig  . buPROPion (WELLBUTRIN XL) 150 MG 24 hr tablet Take 150 mg by mouth daily. or as directed  . estradiol (CLIMARA - DOSED IN MG/24 HR) 0.1 mg/24hr patch APPLY 1 PATCH WEEKLY TOPICALLY  . [DISCONTINUED] buPROPion (WELLBUTRIN XL) 150 MG 24 hr tablet Take 2 tablets (300 mg total) by mouth daily. or as directed    EXAM:  BP 150/90  Temp(Src) 98.2 F (36.8 C) (Oral)  Ht 5\' 9"  (1.753 m)  Wt 171 lb (77.565 kg)  BMI 25.24 kg/m2  Body mass index is 25.24 kg/(m^2).  GENERAL: vitals reviewed and listed above, alert, oriented, appears  well hydrated and in no acute distress MS: moves all extremities  Left foot puffy dorsal foot anterior ankle  No bony tenderness redness and NV seem intact neg squeeze test . Gait basically normal  PSYCH: pleasant and cooperative, no obvious depression or anxiety  ASSESSMENT AND PLAN:  Discussed the following assessment and plan:  Foot injury, left, sequela - Plan: Ambulatory referral to Sports Medicine  Foot pain, left - Plan: Ambulatory referral to Sports Medicine Post 2.5 months and still visible swelling  ? Traumatic soft tissue injury but because of persistence should have  further evaluation .   Refer to sports medicine  For appt.  Continue conservative measures   chck bp readiings at home   Says is  Below 140/90 when last checked  -Patient advised to return or notify health care team  if symptoms worsen ,persist or new concerns arise.  Patient Instructions  Will do a referral to sports medicine. Referral .  As we discussed .     Neta Mends. Glinda Natzke M.D.

## 2013-12-14 NOTE — Patient Instructions (Signed)
Will do a referral to sports medicine. Referral .  As we discussed .

## 2013-12-20 ENCOUNTER — Telehealth: Payer: Self-pay | Admitting: Family Medicine

## 2013-12-20 NOTE — Telephone Encounter (Signed)
Normal bps noted

## 2013-12-20 NOTE — Telephone Encounter (Signed)
Pt calling to inform WP of BP results taken on 3 different days.  121/86, 128/83 and 122/84

## 2013-12-22 ENCOUNTER — Ambulatory Visit: Payer: BC Managed Care – PPO | Admitting: Family Medicine

## 2013-12-30 ENCOUNTER — Ambulatory Visit: Payer: BC Managed Care – PPO | Admitting: Family Medicine

## 2014-01-06 ENCOUNTER — Encounter: Payer: Self-pay | Admitting: Family Medicine

## 2014-01-06 ENCOUNTER — Ambulatory Visit (INDEPENDENT_AMBULATORY_CARE_PROVIDER_SITE_OTHER): Payer: BC Managed Care – PPO | Admitting: Family Medicine

## 2014-01-06 ENCOUNTER — Other Ambulatory Visit (INDEPENDENT_AMBULATORY_CARE_PROVIDER_SITE_OTHER): Payer: BC Managed Care – PPO

## 2014-01-06 VITALS — BP 124/82 | HR 55 | Ht 69.0 in | Wt 172.0 lb

## 2014-01-06 DIAGNOSIS — M79672 Pain in left foot: Secondary | ICD-10-CM

## 2014-01-06 DIAGNOSIS — M84376A Stress fracture, unspecified foot, initial encounter for fracture: Secondary | ICD-10-CM

## 2014-01-06 DIAGNOSIS — M216X9 Other acquired deformities of unspecified foot: Secondary | ICD-10-CM | POA: Insufficient documentation

## 2014-01-06 DIAGNOSIS — M216X2 Other acquired deformities of left foot: Secondary | ICD-10-CM

## 2014-01-06 DIAGNOSIS — M79609 Pain in unspecified limb: Secondary | ICD-10-CM

## 2014-01-06 DIAGNOSIS — M84375A Stress fracture, left foot, initial encounter for fracture: Secondary | ICD-10-CM

## 2014-01-06 MED ORDER — DICLOFENAC SODIUM 2 % TD SOLN: TRANSDERMAL | Status: DC

## 2014-01-06 NOTE — Progress Notes (Signed)
  Tawana ScaleZach Smith D.O. [facility] [address] [place], [place] [address] Phone: [phone] Subjective:    I'm seeing this patient by the request  of:  Lorretta HarpPANOSH,WANDA KOTVAN, MD   CC: Left foot pain   BJY:[id number]:Subjective Carol Mcdaniel is a 63 y.o. female coming in with complaint of the pain. It states that this started when she tripped over her sandal proximally 3 months ago. Patient did have a significant amount of pain and swelling. Patient states since that time it has started to get better but it was in a very slow process. Patient states though that she is able to ambulate without any significant pain but by the in the day she can and swelling as well as some mild pain on the dorsal aspect of the foot. Patient states is more lateral and medial. Denies any specific location. Patient describes it as dull aching. No nighttime awakening. Does respond well to anti-inflammatories. Patient rates the pain as 5/10 in severity.     Past medical history, social, surgical and family history all reviewed in electronic medical record.   Review of Systems: No headache, visual changes, nausea, vomiting, diarrhea, constipation, dizziness, abdominal pain, skin rash, fevers, chills, night sweats, weight loss, swollen lymph nodes, body aches, joint swelling, muscle aches, chest pain, shortness of breath, mood changes.   Objective Blood pressure 124/82, pulse 55, height 5\' 9"  (1.753 m), weight 172 lb (78.019 kg), SpO2 99.00%.  General: No apparent distress alert and oriented x3 mood and affect normal, dressed appropriately.  HEENT: Pupils equal, extraocular movements intact  Respiratory: Patient's speak in full sentences and does not appear short of breath  Cardiovascular: No lower extremity edema, non tender, no erythema  Skin: Warm dry intact with no signs of infection or rash on extremities or on axial skeleton.  Abdomen: Soft nontender  Neuro: Cranial nerves II through XII are intact,  neurovascularly intact in all extremities with 2+ DTRs and 2+ pulses.  Lymph: No lymphadenopathy of posterior or anterior cervical chain or axillae bilaterally.  Gait normal with good balance and coordination.  MSK:  Non tender with full range of motion and good stability and symmetric strength and tone of shoulders, elbows, wrist, hip, knee and ankles bilaterally.  Foot exam shows the patient does have loss of the transverse arch bilaterally left greater than right with bunion and bunionette formation. Patient is minimally tender to palpation over the dorsal aspect of the foot. No specific findings. Patient has full range of motion of the ankle with full strength and is neurovascularly intact distally.  Limited musculoskeletal ultrasound was performed and interpreted by Antoine PrimasSMITH, ZACHARY, M Limited ultrasound shows no significant bony deformity but seems to have a healed base of fourth metatarsal fracture noted. Increasing Doppler flow still an area with very mild hypoechoic changes.  Impression: Healed fourth metatarsal fracture recent     Impression and Recommendations:     This case required medical decision making of moderate complexity.

## 2014-01-06 NOTE — Assessment & Plan Note (Signed)
Patient on ultrasound showed that she does have healing overall. That she is doing very well at this time. Encourage her to try to get other shoes that ankle be beneficial. We are undergo a topical anti-inflammatory as well as an icing protocol. We discussed proper shoe choices and how to avoid being barefoot. Patient also is going to come back again in 4 weeks for further evaluation and treatment.

## 2014-01-06 NOTE — Assessment & Plan Note (Addendum)
Bunion bunion formation secondary to loss of this. We discussed finding shoes with a wide toebox. We discussed metatarsal pads and arch to be beneficial. We discussed home exercises and showed proper technique. We discussed shoes that could be helpful. Patient given home exercises as well as we stated before. Patient also will do icing protocol. Patient will follow up in 3-4 weeks.

## 2014-01-06 NOTE — Patient Instructions (Signed)
Good to meet you Try the pennsaid twice daily for 5 days then as needed Ice bath at end of day for 20 minutes.  Exercises 3 times a week.  Come back i n4 weeks and we will see how you are doing.

## 2014-01-13 ENCOUNTER — Other Ambulatory Visit: Payer: Self-pay | Admitting: Internal Medicine

## 2014-01-13 NOTE — Telephone Encounter (Signed)
Sent to the pharmacy by e-scribe. 

## 2014-02-08 ENCOUNTER — Ambulatory Visit: Payer: BC Managed Care – PPO | Admitting: Family Medicine

## 2014-02-08 DIAGNOSIS — Z0289 Encounter for other administrative examinations: Secondary | ICD-10-CM

## 2014-04-24 ENCOUNTER — Telehealth: Payer: Self-pay | Admitting: Family Medicine

## 2014-04-24 NOTE — Telephone Encounter (Signed)
Pt called and left a message on my machine.  Stated she has had a cold since 03/16/14.  Did not know if she needed to be seen or if she could have an antibiotic called in.  Called and spoke to the pt.  She complains of post nasal drip, cough and body aches.  Does feel better than she has in the past.  Per Dr. Clent RidgesFry, okay to use SDA slot on 04/26/14 @ 11 AM.

## 2014-04-26 ENCOUNTER — Ambulatory Visit (INDEPENDENT_AMBULATORY_CARE_PROVIDER_SITE_OTHER): Payer: BC Managed Care – PPO | Admitting: Family Medicine

## 2014-04-26 ENCOUNTER — Encounter: Payer: Self-pay | Admitting: Family Medicine

## 2014-04-26 VITALS — BP 127/76 | HR 63 | Temp 98.0°F | Ht 69.0 in | Wt 159.0 lb

## 2014-04-26 DIAGNOSIS — J0100 Acute maxillary sinusitis, unspecified: Secondary | ICD-10-CM

## 2014-04-26 DIAGNOSIS — R220 Localized swelling, mass and lump, head: Secondary | ICD-10-CM

## 2014-04-26 MED ORDER — AMOXICILLIN-POT CLAVULANATE 875-125 MG PO TABS: 1.0000 | ORAL_TABLET | Freq: Two times a day (BID) | ORAL | Status: DC

## 2014-04-26 NOTE — Progress Notes (Signed)
Pre visit review using our clinic review tool, if applicable. No additional management support is needed unless otherwise documented below in the visit note. 

## 2014-04-26 NOTE — Progress Notes (Signed)
   Subjective:    Patient ID: Carol Mcdaniel, female    DOB: [birth year], 63 y.o.   MRN: [id number]  HPI Here for 2 things. First she has had sinus pressure with PND and coughing up sputum for 5 weeks. No fever. Also for 3 months she has had a slowing enlarging area of swelling in the left cheek. This is not painful. No fevers.    Review of Systems  Constitutional: Negative.   HENT: Positive for congestion, facial swelling, postnasal drip and sinus pressure. Negative for ear pain and sore throat.   Eyes: Negative.   Respiratory: Positive for cough.        Objective:   Physical Exam  Constitutional: She appears well-developed and well-nourished.  HENT:  Left Ear: External ear normal.  Nose: Nose normal.  Mouth/Throat: Oropharynx is clear and moist.  The left cheek is swollen diffusely but not tender. No masses are palpated.   Eyes: Conjunctivae are normal.  Neck: No thyromegaly present.  Pulmonary/Chest: Effort normal and breath sounds normal.  Lymphadenopathy:    She has no cervical adenopathy.          Assessment & Plan:  Treat the sinusitis with Augmentin. Refer to ENT for the facial swelling. I am not sure what the etiology of this may be.

## 2014-06-12 ENCOUNTER — Encounter: Payer: BC Managed Care – PPO | Admitting: Internal Medicine

## 2014-06-15 ENCOUNTER — Ambulatory Visit: Payer: Self-pay | Admitting: Family Medicine

## 2014-07-05 ENCOUNTER — Other Ambulatory Visit: Payer: Self-pay | Admitting: Internal Medicine

## 2014-08-01 ENCOUNTER — Telehealth: Payer: Self-pay | Admitting: Family Medicine

## 2014-08-01 ENCOUNTER — Other Ambulatory Visit: Payer: Self-pay | Admitting: Internal Medicine

## 2014-08-01 ENCOUNTER — Other Ambulatory Visit: Payer: Self-pay | Admitting: Family Medicine

## 2014-08-01 DIAGNOSIS — Z0000 Encounter for general adult medical examination without abnormal findings: Secondary | ICD-10-CM

## 2014-08-01 NOTE — Telephone Encounter (Signed)
lmom for pt to cb

## 2014-08-01 NOTE — Telephone Encounter (Signed)
This patient is now due for CPX.  Please help her to make a lab and cpx appointments.  I have placed the orders for lab work.  Thanks!

## 2014-08-01 NOTE — Telephone Encounter (Signed)
S/w pt will be in on 11/06/14 Misty was not aware pt already scheduled

## 2014-08-01 NOTE — Telephone Encounter (Signed)
Sent to the pharmacy by e-scribe.  Pt is now due for a CPX.  Will send a message to scheduling.

## 2014-08-18 ENCOUNTER — Ambulatory Visit (INDEPENDENT_AMBULATORY_CARE_PROVIDER_SITE_OTHER): Payer: BLUE CROSS/BLUE SHIELD | Admitting: Internal Medicine

## 2014-08-18 ENCOUNTER — Encounter: Payer: Self-pay | Admitting: Internal Medicine

## 2014-08-18 VITALS — BP 130/80 | HR 58 | Temp 97.9°F | Resp 20 | Ht 69.0 in | Wt 159.0 lb

## 2014-08-18 DIAGNOSIS — J340 Abscess, furuncle and carbuncle of nose: Secondary | ICD-10-CM

## 2014-08-18 MED ORDER — CEPHALEXIN 500 MG PO CAPS: 500.0000 mg | ORAL_CAPSULE | Freq: Four times a day (QID) | ORAL | Status: DC

## 2014-08-18 NOTE — Progress Notes (Signed)
Subjective:    Patient ID: Carol Mcdaniel, female    DOB: Dec 10, 1950, 64 y.o.   MRN: [id number]  HPI  64 year old patient who suffered a frostbite/cold injury to the nose in January.  She subsequently developed a mild cellulitis which responded well to cephalexin.  More recently has developed some irritation in this area with some crusting and for the past 3 days, increasing redness, tenderness and some soft tissue swelling  Past Medical History  Diagnosis Date  . Trans-sexualism with unspecified sexual history   . History of chickenpox   . Renal stone     ?  Marland Kitchen. Heart murmur     no eval ? functional  . Fracture     arm in HS  . LIVER FUNCTION TESTS, ABNORMAL 04/04/2008    Qualifier: Diagnosis of  By: Fabian SharpPanosh MD, Neta MendsWanda K Poss related to intense exercise    . Hx of Moh's micrographic surgery for skin cancer 2013    r arm     History   Social History  . Marital Status: Single    Spouse Name: N/A  . Number of Children: N/A  . Years of Education: N/A   Occupational History  . Not on file.   Social History Main Topics  . Smoking status: Never Smoker   . Smokeless tobacco: Never Used  . Alcohol Use: 0.0 oz/week    0 Standard drinks or equivalent per week     Comment: rare to none now  . Drug Use: No  . Sexual Activity: Not on file   Other Topics Concern  . 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    Plays musical instruments violin   Neg tad                   Past Surgical History  Procedure Laterality Date  . Transgender reassignment  11/02    [place]  . Arthoscopic surgery for herniated disc  01/1997    [place]  . Tonsillectomy    . Mohs surgery      right arm 2013     Family History  Problem Relation Age of Onset  . Alcohol abuse Father     died [id number]  . Other Brother     overweight    No Known Allergies  Current Outpatient Prescriptions on File Prior to Visit  Medication Sig Dispense Refill  . buPROPion (WELLBUTRIN XL) 150 MG 24 hr tablet TAKE TWO TABLETS BY MOUTH EVERY DAY 60 tablet 0  . estradiol (CLIMARA - DOSED IN MG/24 HR) 0.1 mg/24hr patch APPLY ONE PATCH TOPICALLY EVERY WEEK 4 patch 4   No current facility-administered medications on file prior to visit.    BP 130/80 mmHg  Pulse 58  Temp(Src) 97.9 F (36.6 C) (Oral)  Resp 20  Ht 5\' 9"  (1.753 m)  Wt 159 lb (72.122 kg)  BMI 23.47 kg/m2  SpO2 98%     Review of Systems  Skin: Positive for rash.       Objective:   Physical Exam  HENT:  The left the lower septal area and left lateral nose were  slightly erythematous, tender.  No obvious pustule          Assessment & Plan:   Early cellulitis, left nasal area.  Will treat with cephalexin, warm compresses Patient self-care treatment discussed and dispensed

## 2014-08-18 NOTE — Progress Notes (Signed)
Pre visit review using our clinic review tool, if applicable. No additional management support is needed unless otherwise documented below in the visit note. 

## 2014-08-18 NOTE — Patient Instructions (Signed)
Take your antibiotic as prescribed until ALL of it is gone, but stop if you develop a rash, swelling, or any side effects of the medication.  Contact our office as soon as possible if  there are side effects of the medication.  Warm compresses to the affected area 3-4 times daily  HOME CARE  Take your antibiotic medicine as told. Finish the medicine even if you start to feel better.   Put a warm cloth on the area up to 4 times per day.  Only take medicines as told by your doctor.   GET HELP IF:  You see red streaks on the skin coming from the infected area.  Your red area gets bigger or turns a dark color.  Your bone or joint under the infected area is painful after the skin heals.  Your infection comes back in the same area or different area.  You have a puffy (swollen) bump in the infected area.  You have new symptoms.  You have a fever.

## 2014-08-23 ENCOUNTER — Encounter: Payer: Self-pay | Admitting: Internal Medicine

## 2014-08-23 ENCOUNTER — Telehealth: Payer: Self-pay | Admitting: Internal Medicine

## 2014-08-23 ENCOUNTER — Ambulatory Visit (INDEPENDENT_AMBULATORY_CARE_PROVIDER_SITE_OTHER): Payer: BLUE CROSS/BLUE SHIELD | Admitting: Internal Medicine

## 2014-08-23 VITALS — BP 136/90 | Temp 97.8°F | Ht 69.0 in | Wt 158.8 lb

## 2014-08-23 DIAGNOSIS — J3489 Other specified disorders of nose and nasal sinuses: Secondary | ICD-10-CM

## 2014-08-23 DIAGNOSIS — J340 Abscess, furuncle and carbuncle of nose: Secondary | ICD-10-CM

## 2014-08-23 NOTE — Telephone Encounter (Signed)
error 

## 2014-08-23 NOTE — Patient Instructions (Signed)
I think this should resolve with current  Treatment  Continue all of antibiotic and  Nose  Ointment   If relapses or not better  After rx then contact us for follow up.  Let us know   Culture  Results.   When available..Marland Kitchen

## 2014-08-23 NOTE — Progress Notes (Signed)
Pre visit review using our clinic review tool, if applicable. No additional management support is needed unless otherwise documented below in the visit note.  Chief Complaint  Patient presents with  . Follow-up    HPI: Carol Mcdaniel  comesin for fu of infection of nose   After injury from frostbite   In January and rx with keflex  And has 2 sores inside  Nose  And  Continuing and then came in left internal   Did keflex  Swelling   [facility]    augemtin and bactoban tid   .   10 days  And 3-4 days in to .  Dis culture.  A lot better no fever some tenderness current no draingage Future Appointments Date Time Provider Department Center  11/06/2014 10:00 AM Madelin Headings, MD [facility]    ROS: See pertinent positives and negatives per HPI. Nl cp pulm sx no hx of same problem   Past Medical History  Diagnosis Date  . Trans-sexualism with unspecified sexual history   . History of chickenpox   . Renal stone     ?  Marland Kitchen Heart murmur     no eval ? functional  . Fracture     arm in HS  . LIVER FUNCTION TESTS, ABNORMAL 04/04/2008    Qualifier: Diagnosis of  By: Fabian Sharp MD, Neta Mends Poss related to intense exercise    . Hx of Moh's micrographic surgery for skin cancer 2013    r arm     Family History  Problem Relation Age of Onset  . Alcohol abuse Father     died 68  . Other Brother     overweight    History   Social History  . Marital Status: Single    Spouse Name: N/A  . Number of Children: N/A  . Years of Education: N/A   Social History Main Topics  . Smoking status: Never Smoker   . Smokeless tobacco: Never Used  . Alcohol Use: 0.0 oz/week    0 Standard drinks or equivalent per week     Comment: rare to none now  . Drug Use: No  . Sexual Activity: Not on file   Other Topics Concern  . 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    Plays musical instruments violin   Neg tad                   Outpatient Encounter Prescriptions as of 08/23/2014  Medication Sig  . amoxicillin-clavulanate (AUGMENTIN) 875-125 MG per tablet Take 1 tablet by mouth 2 (two) times daily.  Marland Kitchen buPROPion (WELLBUTRIN XL) 150 MG 24 hr tablet TAKE TWO TABLETS BY MOUTH EVERY DAY  . estradiol (CLIMARA - DOSED IN MG/24 HR) 0.1 mg/24hr patch APPLY ONE PATCH TOPICALLY EVERY WEEK  . mupirocin ointment (BACTROBAN) 2 % Apply topically three times daily  . [DISCONTINUED] cephALEXin (KEFLEX) 500 MG capsule Take 1 capsule (500 mg total) by mouth 4 (four) times daily.    EXAM:  BP 136/90 mmHg  Temp(Src) 97.8 F (36.6 C) (Oral)  Ht 5\' 9"  (1.753 m)  Wt 158 lb 12.8 oz (72.031 kg)  BMI 23.44 kg/m2  Body mass index is 23.44 kg/(m^2).  GENERAL: vitals reviewed and listed above, alert, oriented, appears well hydrated and in no acute distress face symmetrical  Slight redness and left ant nares HEENT: atraumatic, conjunctiva  clear,  Left nasal crusting nl nose otherwise  Face non tender s OP : no lesion edema or exudate  NECK: no obvious masses on inspection palpation no adenopathy  MS: moves all extremities without noticeable focal  abnormality PSYCH: pleasant and cooperative, no obvious depression or anxiety reviewed data and hx  ASSESSMENT AND PLAN:  Discussed the following assessment and plan:  Cellulitis of nasal tip  Infection of nose - better on augmentin  culture pending   -Patient advised to return or notify health care team  if symptoms worsen ,persist or new concerns arise.  Patient Instructions  I think this should resolve with current  Treatment  Continue all of antibiotic and  Nose  Ointment   If relapses or not better  After rx then contact  us for follow up.  Let us know   Culture  Results.   When available.Neta Mends.       Carol Mcdaniel M.D.

## 2014-09-04 ENCOUNTER — Encounter: Payer: Self-pay | Admitting: Internal Medicine

## 2014-09-04 ENCOUNTER — Ambulatory Visit (INDEPENDENT_AMBULATORY_CARE_PROVIDER_SITE_OTHER): Payer: BLUE CROSS/BLUE SHIELD | Admitting: Internal Medicine

## 2014-09-04 VITALS — BP 120/80 | HR 63 | Temp 98.1°F | Resp 20 | Ht 69.0 in | Wt 160.0 lb

## 2014-09-04 DIAGNOSIS — J340 Abscess, furuncle and carbuncle of nose: Secondary | ICD-10-CM | POA: Diagnosis not present

## 2014-09-04 DIAGNOSIS — A4902 Methicillin resistant Staphylococcus aureus infection, unspecified site: Secondary | ICD-10-CM | POA: Diagnosis not present

## 2014-09-04 DIAGNOSIS — J3489 Other specified disorders of nose and nasal sinuses: Secondary | ICD-10-CM | POA: Diagnosis not present

## 2014-09-04 MED ORDER — SULFAMETHOXAZOLE-TRIMETHOPRIM 800-160 MG PO TABS: 2.0000 | ORAL_TABLET | Freq: Two times a day (BID) | ORAL | Status: DC

## 2014-09-04 NOTE — Progress Notes (Signed)
Pre visit review using our clinic review tool, if applicable. No additional management support is needed unless otherwise documented below in the visit note. 

## 2014-09-04 NOTE — Progress Notes (Signed)
Chief Complaint  Patient presents with  . Follow-up    MRSA in Nose    HPI: Carol Mcdaniel 64 y.o.   Patient Carol Mcdaniel  comes in today for SDA for  Recurrent ?  problem evaluation. finished doxy   ;last week and swelling was almost  gone   And then  noticed beginning to swell.   recenetly  Last dose of med was  aprill 12th No fever finished topical antibiotic  ROS: See pertinent positives and negatives per HPI. No new sx   Past Medical History  Diagnosis Date  . Trans-sexualism with unspecified sexual history   . History of chickenpox   . Renal stone     ?  Marland Kitchen Heart murmur     no eval ? functional  . Fracture     arm in HS  . LIVER FUNCTION TESTS, ABNORMAL 04/04/2008    Qualifier: Diagnosis of  By: Fabian Sharp MD, Neta Mends Poss related to intense exercise    . Hx of Moh's micrographic surgery for skin cancer 2013    r arm   . ADJ DISORDER WITH MIXED ANXIETY & DEPRESSED MOOD 03/06/2008    Qualifier: Diagnosis of  By: Fabian Sharp MD, Neta Mends     Family History  Problem Relation Age of Onset  . Alcohol abuse Father     died 48  . Other Brother     overweight    History   Social History  . Marital Status: Single    Spouse Name: N/A  . Number of Children: N/A  . Years of Education: N/A   Social History Main Topics  . Smoking status: Never Smoker   . Smokeless tobacco: Never Used  . Alcohol Use: 0.0 oz/week    0 Standard drinks or equivalent per week     Comment: rare to none now  . Drug Use: No  . Sexual Activity: Not on file   Other Topics Concern  . 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    Plays musical instruments violin   Neg tad                   Outpatient Encounter Prescriptions as of 09/04/2014  Medication Sig  . buPROPion (WELLBUTRIN XL) 150 MG 24 hr tablet TAKE TWO TABLETS BY MOUTH EVERY DAY  . estradiol (CLIMARA - DOSED IN MG/24 HR) 0.1 mg/24hr patch APPLY ONE PATCH TOPICALLY EVERY WEEK  . sulfamethoxazole-trimethoprim (BACTRIM DS,SEPTRA DS) 800-160 MG per tablet Take 2 tablets by mouth 2 (two) times daily.    EXAM:  BP 120/80 mmHg  Pulse 63  Temp(Src) 98.1 F (36.7 C) (Oral)  Resp 20  Ht  (1.753 m)  Wt 160 lb (72.576 kg)  BMI 23.62 kg/m2  SpO2 98%  Body mass index is 23.62 kg/(m^2).  GENERAL: vitals reviewed and listed above, alert, oriented, appears well hydrated and in no acute distress HEENT: atraumatic, conjunctiva  clear, no obvious abnormalities on inspection of external nose and ears  Slight erythema swelling base of left nostril   No pus seen no  nodule   OP : no lesion edema or exudate  NECK: no obvious masses on inspection palpation  PSYCH: pleasant and cooperative, no obvious depression or anxiety  ASSESSMENT AND PLAN:  Discussed the following assessment and plan:  Cellulitis of nasal tip  Infection of nose Relapsing sx early much puffiness to base of nose  change antibiotic  And use high dose  consideration of clindamycin but if  Needed get ent to see .  -Patient advised to return or notify health care team  if symptoms worsen ,persist or new concerns arise.  Patient Instructions  Agree with more treatment for the nose infection.   cahnge antibiotic to septra  Ds  As discussed  If  persistent or progressive consider having ent see you .   Contact  us if relapsing again.  MRSA FAQs What is MRSA? Staphylococcus aureus (pronounced staff-ill-oh-KOK-us AW-ree-us), or "Staph" is a very common germ that about 1 out of every 3 people have on their skin or in their nose. This germ does not cause any problems for most people who have it on their skin. But sometimes it can cause  serious infections such as skin or wound infections, pneumonia, or infections of the blood.  Antibiotics are given to kill Staph germs when they cause infections. Some Staph are resistant, meaning they cannot be killed by some antibiotics. "Methicillin-resistant Staphylococcus aureus" or "MRSA" is a type of Staph that is resistant to some of the antibiotics that are often used to treat Staph infections. Who is most likely to get an MRSA infection? In the hospital, people who are more likely to get an MRSA infection are people who:  have other health conditions making them sick  have been in the hospital or a nursing home  have been treated with antibiotics. People who are healthy and who have not been in the hospital or a nursing home can also get MRSA infections. These infections usually involve the skin. More information about this type of MRSA infection, known as "community-associated MRSA" infection, is available from the Centers for Disease Control and Prevention (CDC). ReportNation.uy How do I get an MRSA infection? People who have MRSA germs on their skin or who are infected with MRSA may be able to spread the germ to other people. MRSA can be passed on to bed linens, bed rails, bathroom fixtures, and medical equipment. It can spread to other people on contaminated equipment and on the hands of doctors, nurses, other healthcare providers and visitors. Can MRSA infections be treated? Yes, there are antibiotics that can kill MRSA germs. Some patients with MRSA abscesses may need surgery to drain the infection. Your healthcare provider will determine which treatments are best for you. What are some of the things that hospitals are doing to prevent MRSA infections? To prevent MRSA infections, doctors, nurses and other healthcare providers:  Clean their hands with soap and water or an alcohol-based hand rub before and after caring for every patient.  Carefully clean hospital rooms and  medical equipment.  Use Contact Precautions when caring for patients with MRSA. Contact Precautions mean:  Whenever possible, patients with MRSA will have a single room or will share a room only with someone else who also has MRSA.  Healthcare providers will put on gloves and wear a gown over their clothing while taking care of patients with MRSA.  Visitors may also be asked to wear a gown and gloves.  When leaving the room, hospital providers and visitors remove their gown  and gloves and clean their hands.  Patients on Contact Precautions are asked to stay in their hospital rooms as much as possible. They should not go to common areas, such as the gift shop or cafeteria. They may go to other areas of the hospital for treatments and tests.  May test some patients to see if they have MRSA on their skin. This test involves rubbing a cotton-tipped swab in the patient's nostrils or on the skin. What can I do to help prevent MRSA infections? In the hospital  Make sure that all doctors, nurses, and other healthcare providers clean their hands with soap and water or an alcohol-based hand rub before and after caring for you. If you do not see your providers clean their hands, please ask them to do so. When you go home  If you have wounds or an intravascular device (such as a catheter or dialysis port) make sure that you know how to take care of them. Can my friends and family get MRSA when they visit me? The chance of getting MRSA while visiting a person who has MRSA is very low. To decrease the chance of getting MRSA your family and friends should:  Clean their hands before they enter your room and when they leave.  Ask a healthcare provider if they need to wear protective gowns and gloves when they visit you. What do I need to do when I go home from the hospital? To prevent another MRSA infection and to prevent spreading MRSA to others:  Keep taking any antibiotics prescribed by your  doctor. Don't take half-doses or stop before you complete your prescribed course.  Clean your hands often, especially before and after changing your wound dressing or bandage.  People who live with you should clean their hands often as well.  Keep any wounds clean and change bandages as instructed until healed.  Avoid sharing personal items such as towels or razors.  Wash and dry your clothes and bed linens in the warmest temperatures recommended on the labels.  Tell your healthcare providers that you have MRSA. This includes home health nurses and aides, therapists, and personnel in doctors' offices.  Your doctor may have more instructions for you. If you have questions, please ask your doctor or nurse. Developed and co-sponsored by [employer] Society for [facility] of [place] [phone](SHEA); Infectious Diseases Society of America (IDSA); [facility]; Association for Professionals in Infection Control and Epidemiology (APIC); Centers for Disease Control and Prevention (CDC); and [employer] Commission. Document Released: 05/10/2013 Document Reviewed: 05/10/2013 [facility] Patient Information 2015 [place]. This information is not intended to replace advice given to you by your health care provider. Make sure you discuss any questions you have with your health care provider.      Neta MendsWanda K. Siddhant Hashemi M.D.

## 2014-09-04 NOTE — Patient Instructions (Addendum)
Agree with more treatment for the nose infection.   cahnge antibiotic to septra  Ds  As discussed  If  persistent or progressive consider having ent see you .   Contact  [place] if relapsing again.  MRSA FAQs What is MRSA? Staphylococcus aureus (pronounced staff-ill-oh-KOK-us AW-ree-us), or "Staph" is a very common germ that about 1 out of every 3 people have on their skin or in their nose. This germ does not cause any problems for most people who have it on their skin. But sometimes it can cause serious infections such as skin or wound infections, pneumonia, or infections of the blood.  Antibiotics are given to kill Staph germs when they cause infections. Some Staph are resistant, meaning they cannot be killed by some antibiotics. "Methicillin-resistant Staphylococcus aureus" or "MRSA" is a type of Staph that is resistant to some of the antibiotics that are often used to treat Staph infections. Who is most likely to get an MRSA infection? In the hospital, people who are more likely to get an MRSA infection are people who:  have other health conditions making them sick  have been in the hospital or a nursing home  have been treated with antibiotics. People who are healthy and who have not been in the hospital or a nursing home can also get MRSA infections. These infections usually involve the skin. More information about this type of MRSA infection, known as "community-associated MRSA" infection, is available from the Centers for Disease Control and Prevention (CDC). ReportNation.uyhttp://www.cdc.gov/mrsa How do I get an MRSA infection? People who have MRSA germs on their skin or who are infected with MRSA may be able to spread the germ to other people. MRSA can be passed on to bed linens, bed rails, bathroom fixtures, and medical equipment. It can spread to other people on contaminated equipment and on the hands of doctors, nurses, other healthcare providers and visitors. Can MRSA infections be treated? Yes,  there are antibiotics that can kill MRSA germs. Some patients with MRSA abscesses may need surgery to drain the infection. Your healthcare provider will determine which treatments are best for you. What are some of the things that hospitals are doing to prevent MRSA infections? To prevent MRSA infections, doctors, nurses and other healthcare providers:  Clean their hands with soap and water or an alcohol-based hand rub before and after caring for every patient.  Carefully clean hospital rooms and medical equipment.  Use Contact Precautions when caring for patients with MRSA. Contact Precautions mean:  Whenever possible, patients with MRSA will have a single room or will share a room only with someone else who also has MRSA.  Healthcare providers will put on gloves and wear a gown over their clothing while taking care of patients with MRSA.  Visitors may also be asked to wear a gown and gloves.  When leaving the room, hospital providers and visitors remove their gown and gloves and clean their hands.  Patients on Contact Precautions are asked to stay in their hospital rooms as much as possible. They should not go to common areas, such as the gift shop or cafeteria. They may go to other areas of the hospital for treatments and tests.  May test some patients to see if they have MRSA on their skin. This test involves rubbing a cotton-tipped swab in the patient's nostrils or on the skin. What can I do to help prevent MRSA infections? In the hospital  Make sure that all doctors, nurses, and other healthcare providers  clean their hands with soap and water or an alcohol-based hand rub before and after caring for you. If you do not see your providers clean their hands, please ask them to do so. When you go home  If you have wounds or an intravascular device (such as a catheter or dialysis port) make sure that you know how to take care of them. Can my friends and family get MRSA when they visit  me? The chance of getting MRSA while visiting a person who has MRSA is very low. To decrease the chance of getting MRSA your family and friends should:  Clean their hands before they enter your room and when they leave.  Ask a healthcare provider if they need to wear protective gowns and gloves when they visit you. What do I need to do when I go home from the hospital? To prevent another MRSA infection and to prevent spreading MRSA to others:  Keep taking any antibiotics prescribed by your doctor. Don't take half-doses or stop before you complete your prescribed course.  Clean your hands often, especially before and after changing your wound dressing or bandage.  People who live with you should clean their hands often as well.  Keep any wounds clean and change bandages as instructed until healed.  Avoid sharing personal items such as towels or razors.  Wash and dry your clothes and bed linens in the warmest temperatures recommended on the labels.  Tell your healthcare providers that you have MRSA. This includes home health nurses and aides, therapists, and personnel in doctors' offices.  Your doctor may have more instructions for you. If you have questions, please ask your doctor or nurse. Developed and co-sponsored by [employer] for [employer] [place] [phone]); Infectious Diseases Society of America (IDSA); [facility] Association; Association for Professionals in Infection Control and Epidemiology (APIC); Centers for Disease Control and Prevention (CDC); and [employer] Document Released: 05/10/2013 Document Reviewed: 05/10/2013 [facility] Patient Information 2015 [place], [place]. This information is not intended to replace advice given to you by your health care provider. Make sure you discuss any questions you have with your health care provider.

## 2014-09-07 ENCOUNTER — Telehealth: Payer: Self-pay | Admitting: Family Medicine

## 2014-09-07 ENCOUNTER — Encounter: Payer: BLUE CROSS/BLUE SHIELD | Admitting: Internal Medicine

## 2014-09-07 MED ORDER — CLINDAMYCIN HCL 300 MG PO CAPS: 300.0000 mg | ORAL_CAPSULE | Freq: Three times a day (TID) | ORAL | Status: DC

## 2014-09-07 NOTE — Telephone Encounter (Signed)
Left a message on home/cell to pick up new antibiotic and to get a probiotic to help prevent diarrhea.  Instructed to call back if any questions.

## 2014-09-07 NOTE — Telephone Encounter (Signed)
Agree with stopping med       Clindamycin  To take for 7 days   Probiotics to help prevent diarrhea

## 2014-09-07 NOTE — Progress Notes (Signed)
Document opened and reviewed for OV but appt  canceled same day . Car trouble see phone note.

## 2014-09-07 NOTE — Telephone Encounter (Signed)
Spoke to the pt.  She stopped taking Bactrim this morning due to muscle tingling, chills, constipation and blisters on her arm.  She states all sx are now gone.  Infection in her nose is better but still present.  She [place]

## 2014-09-26 ENCOUNTER — Encounter: Payer: Self-pay | Admitting: Internal Medicine

## 2014-09-26 ENCOUNTER — Ambulatory Visit (INDEPENDENT_AMBULATORY_CARE_PROVIDER_SITE_OTHER): Payer: BLUE CROSS/BLUE SHIELD | Admitting: Internal Medicine

## 2014-09-26 VITALS — BP 120/80 | Temp 98.0°F | Ht 69.0 in | Wt 157.2 lb

## 2014-09-26 DIAGNOSIS — J3489 Other specified disorders of nose and nasal sinuses: Secondary | ICD-10-CM

## 2014-09-26 DIAGNOSIS — A4902 Methicillin resistant Staphylococcus aureus infection, unspecified site: Secondary | ICD-10-CM | POA: Diagnosis not present

## 2014-09-26 NOTE — Patient Instructions (Signed)
Compresses  3-4 x per day.  And restart  The bactroban  Antibiotic topical .. FOR ABOUT 2 WEEKS  For now  no oral antibiotic but  If flares  ....     We can add back ora clinda . If needed.  Contact us .

## 2014-09-26 NOTE — Progress Notes (Signed)
Pre visit review using our clinic review tool, if applicable. No additional management support is needed unless otherwise documented below in the visit note.  Chief Complaint  Patient presents with  . Follow-up    HPI: Carol Mcdaniel 64 y.o.  Comes in with recurrent issues nasal infection and tenderness redness. See past notes  Had rash ion left arm like a blister  And didn't feel right with septra . So stopped and given clinda  . All healed  Stopped about May 2  Then 3 days later noted a whitehead in right nostril area  Used hot compress and improved but still has some swelling feeling and redness that area . No fever  No sig pain now ? What to do ?  ROS: See pertinent positives and negatives per HPI.  Past Medical History  Diagnosis Date  . Trans-sexualism with unspecified sexual history   . History of chickenpox   . Renal stone     ?  Marland Kitchen. Heart murmur     no eval ? functional  . Fracture     arm in HS  . LIVER FUNCTION TESTS, ABNORMAL 04/04/2008    Qualifier: Diagnosis of  By: Fabian SharpPanosh MD, Neta MendsWanda K Poss related to intense exercise    . Hx of Moh's micrographic surgery for skin cancer 2013    r arm   . ADJ DISORDER WITH MIXED ANXIETY & DEPRESSED MOOD 03/06/2008    Qualifier: Diagnosis of  By: Fabian SharpPanosh MD, Neta MendsWanda K     Family History  Problem Relation Age of Onset  . Alcohol abuse Father     died [id number]  . Other Brother     overweight    History   Social History  . Marital Status: Single    Spouse Name: N/A  . Number of Children: N/A  . Years of Education: N/A   Social History Main Topics  . Smoking status: Never Smoker   . Smokeless tobacco: Never Used  . Alcohol Use: 0.0 oz/week    0 Standard drinks or equivalent per week     Comment: rare to none now  . Drug Use: No  . Sexual Activity: Not on file   Other Topics Concern  . 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    Plays musical instruments violin   Neg tad                   Outpatient Prescriptions Prior to Visit  Medication Sig Dispense Refill  . buPROPion (WELLBUTRIN XL) 150 MG 24 hr tablet TAKE TWO TABLETS BY MOUTH EVERY DAY 60 tablet 0  . estradiol (CLIMARA - DOSED IN MG/24 HR) 0.1 mg/24hr patch APPLY ONE PATCH TOPICALLY EVERY WEEK 4 patch 4  . clindamycin (CLEOCIN) 300 MG capsule Take 1 capsule (300 mg total) by mouth 3 (three) times daily. 30 capsule 0  . sulfamethoxazole-trimethoprim (BACTRIM DS,SEPTRA DS) 800-160 MG per tablet Take 2 tablets by mouth 2 (two) times daily. 40 tablet 0   No facility-administered medications prior to visit.     EXAM:  BP 120/80  mmHg  Temp(Src) 98 F (36.7 C) (Oral)  Ht 5\' 9"  (1.753 m)  Wt 157 lb 3.2 oz (71.305 kg)  BMI 23.20 kg/m2  Body mass index is 23.2 kg/(m^2).  GENERAL: vitals reviewed and listed above, alert, oriented, appears well hydrated and in no acute distress HEENT: atraumatic, conjunctiva  clear, no obvious abnormalities on inspection of external nose and ears  Right internal nares slight erythema  No mass effeect on palpations  No crusting   NECK: no obvious masses on inspection palpation  PSYCH: pleasant and cooperative, no obvious depression or anxiety  ASSESSMENT AND PLAN:  Discussed the following assessment and plan:  Infection of nose - better been left side doxy then clinda better and now some sx on right begin antibiotic cream bactroban  and local care call if incr for poss meds no abscess  MRSA (methicillin resistant Staphylococcus aureus) hx  - cx nasal  infection No obv deformity or cartilage infection  Ob     Topical measures at this time -Patient advised to return or notify health care team  if  symptoms worsen ,persist or new concerns arise.  Patient Instructions  Compresses  3-4 x per day.  And restart  The bactroban  Antibiotic topical .. FOR ABOUT 2 WEEKS  For now  no oral antibiotic but  If flares  ....     We can add back ora clinda . If needed.  Contact us .       Neta MendsWanda K. Waqas Bruhl M.D.

## 2014-09-28 ENCOUNTER — Telehealth: Payer: Self-pay | Admitting: *Deleted

## 2014-09-28 ENCOUNTER — Other Ambulatory Visit: Payer: Self-pay | Admitting: Internal Medicine

## 2014-09-28 NOTE — Telephone Encounter (Signed)
Noted  

## 2014-09-28 NOTE — Telephone Encounter (Signed)
Sent to the pharmacy by e-scribe. 

## 2014-09-28 NOTE — Telephone Encounter (Signed)
[facility] Night - Client TELEPHONE ADVICE RECORD [facility] Patient Name: Carol Mcdaniel Carol Mcdaniel Gender: Female DOB: 08-01-1950 Age: [age] Y 1 M 21 D Return Phone Number: [phone] (Primary) Address: [place]: [place] [id number] Client [facility] Night - Client Client Site [facility] - Night Physician Berniece AndreasPanosh, Wanda Contact Type Call Call Type Triage / Clinical Relationship To Patient Self Return Phone Number [phone] (Primary) Chief Complaint Pain - Generalized Initial Comment Caller states she is pale sore/ achy has MRSA. Sx are getting worse. Would like to know if she should take more antibiotics. GOTO Facility Not Listed See MD within 4 hrs PreDisposition Home Care Nurse Assessment Nurse: Harlon FlorWhitaker, RN, Darl PikesSusan Date/Time (Eastern Time): 09/27/2014 7:00:51 PM Confirm and document reason for call. If symptomatic, describe symptoms. ---Caller states she is pale sore/ achy has MRSA. Sx are getting worse. Would like to know if she should take more antibiotics. Infection for 5 wks now. Saw PCP yesterday and was told to try warm compresses. Under her nose there are still white pimples. RX bactroban. Honestly she feels very sore. current temp is cannot locate thermometer. feverish she is allergic to Sulfa . broke out in tiny blisters. She has MRSA under her nose in septum of nose and in upper lip. She had frost bite this winter. and her thought is that this began the process. Has the patient traveled out of the country within the last 30 days? ---No Does the patient require triage? ---Yes Related visit to physician within the last 2 weeks? ---Yes Does the PT have any chronic conditions? (i.e. diabetes, asthma, etc.) ---Yes List chronic conditions. ---depression Vit D probiotics Guidelines Guideline Title Affirmed Question Affirmed Notes Nurse Date/Time (Eastern Time) Boil (Skin Abscess) Red  streak from area of infection Whitaker, RN, Darl PikesSusan 09/27/2014 7:04:50 PM Disp. Time Lamount Cohen(Eastern Time) Disposition Final User 09/27/2014 7:05:38 PM See Physician within 4 Hours (or PCP triage) Yes Harlon FlorWhitaker, RN, Darl PikesSusan

## 2014-09-28 NOTE — Telephone Encounter (Signed)
Called pt and she states feeling better and she does not think it is related to the MRSA infection. She thinks it could be just a cold or a coincidence.  She stated that she would call back tomorrow if the symptoms come back.  Told pt to call back immediately if she develops fever or SOB or go to the ED.  She verbalized understanding and had no questions.  [facility] Fabian SharpPanosh

## 2014-10-31 ENCOUNTER — Other Ambulatory Visit: Payer: Self-pay | Admitting: Internal Medicine

## 2014-10-31 NOTE — Telephone Encounter (Signed)
Sent to the pharmacy by e-scribe. 

## 2014-11-06 ENCOUNTER — Ambulatory Visit (INDEPENDENT_AMBULATORY_CARE_PROVIDER_SITE_OTHER): Payer: BLUE CROSS/BLUE SHIELD | Admitting: Internal Medicine

## 2014-11-06 ENCOUNTER — Encounter: Payer: Self-pay | Admitting: Internal Medicine

## 2014-11-06 VITALS — BP 118/80 | Temp 98.0°F | Ht 68.75 in | Wt 157.4 lb

## 2014-11-06 DIAGNOSIS — Z79899 Other long term (current) drug therapy: Secondary | ICD-10-CM

## 2014-11-06 DIAGNOSIS — Z0000 Encounter for general adult medical examination without abnormal findings: Secondary | ICD-10-CM

## 2014-11-06 LAB — LIPID PANEL
CHOL/HDL RATIO: 3
Cholesterol: 238 mg/dL — ABNORMAL HIGH (ref 0–200)
HDL: 69 mg/dL (ref >39.00)
LDL CALC: 153 mg/dL — AB (ref 0–99)
NonHDL: 169
TRIGLYCERIDES: 80 mg/dL (ref 0.0–149.0)
VLDL: 16 mg/dL (ref 0.0–40.0)

## 2014-11-06 LAB — HEPATIC FUNCTION PANEL
ALK PHOS: 44 U/L (ref 39–117)
ALT: 29 U/L (ref 0–35)
AST: 28 U/L (ref 0–37)
Albumin: 4.3 g/dL (ref 3.5–5.2)
BILIRUBIN TOTAL: 0.8 mg/dL (ref 0.2–1.2)
Bilirubin, Direct: 0.1 mg/dL (ref 0.0–0.3)
Total Protein: 6.8 g/dL (ref 6.0–8.3)

## 2014-11-06 LAB — CBC WITH DIFFERENTIAL/PLATELET
BASOS ABS: 0 10*3/uL (ref 0.0–0.1)
Basophils Relative: 0.6 % (ref 0.0–3.0)
EOS ABS: 0.1 10*3/uL (ref 0.0–0.7)
Eosinophils Relative: 1.7 % (ref 0.0–5.0)
HEMATOCRIT: 40 % (ref 36.0–46.0)
HEMOGLOBIN: 13.3 g/dL (ref 12.0–15.0)
LYMPHS PCT: 35.4 % (ref 12.0–46.0)
Lymphs Abs: 1.6 10*3/uL (ref 0.7–4.0)
MCHC: 33.3 g/dL (ref 30.0–36.0)
MCV: 91.5 fl (ref 78.0–100.0)
Monocytes Absolute: 0.4 10*3/uL (ref 0.1–1.0)
Monocytes Relative: 9.5 % (ref 3.0–12.0)
NEUTROS PCT: 52.8 % (ref 43.0–77.0)
Neutro Abs: 2.4 10*3/uL (ref 1.4–7.7)
Platelets: 269 10*3/uL (ref 150.0–400.0)
RBC: 4.37 Mil/uL (ref 3.87–5.11)
RDW: 14.2 % (ref 11.5–15.5)
WBC: 4.6 10*3/uL (ref 4.0–10.5)

## 2014-11-06 LAB — TSH: TSH: 0.91 u[IU]/mL (ref 0.35–4.50)

## 2014-11-06 LAB — BASIC METABOLIC PANEL
BUN: 15 mg/dL (ref 6–23)
CALCIUM: 9.5 mg/dL (ref 8.4–10.5)
CO2: 31 meq/L (ref 19–32)
Chloride: 102 mEq/L (ref 96–112)
Creatinine, Ser: 1.06 mg/dL (ref 0.40–1.20)
GFR: 55.43 mL/min — AB (ref >60.00)
Glucose, Bld: 81 mg/dL (ref 70–99)
Potassium: 4.5 mEq/L (ref 3.5–5.1)
Sodium: 136 mEq/L (ref 135–145)

## 2014-11-06 NOTE — Progress Notes (Signed)
Pre visit review using our clinic review tool, if applicable. No additional management support is needed unless otherwise documented below in the visit note.  Chief Complaint  Patient presents with  . Annual Exam    nose infection better     HPI: Patient  Carol Mcdaniel  64 y.o. comes in today for Preventive Health Care visit  And med management  116/80 82 range .  bp at home  Goes up here sometimes  Had orthodontinc appt this am  Has invisiline Thinks the nose infection issues has resolved no more rx at this time. No major com just finishing settling moms estate and will be getting mammo and colon nosx  Health Maintenance  Topic Date Due  . MAMMOGRAM  10/31/2012  . COLONOSCOPY  04/18/2013  . HIV Screening  10/18/2015 (Originally 08/05/1965)  . INFLUENZA VACCINE  12/18/2014  . TETANUS/TDAP  11/18/2020  . ZOSTAVAX  Completed   Health Maintenance Review LIFESTYLE:  Exercise:  Walking   And kayaking  Tobacco/ETS: no Alcohol:  no Sugar beverages: no black  coffee Sleep: 5-9 hours depending on work  Drug use: no Colonoscopy: colon screen due will get in [place]  MAMMO: to ge in [place]   ROS:  GEN/ HEENT: No fever, significant weight changes sweats headaches vision problems hearing changes, CV/ PULM; No chest pain shortness of breath cough, syncope,edema  change in exercise tolerance. GI /GU: No adominal pain, vomiting, change in bowel habits. No blood in the stool. No significant GU symptoms. SKIN/HEME: ,no acute skin rashes suspicious lesions or bleeding. No lymphadenopathy, nodules, masses.  NEURO/ PSYCH:  No neurologic signs such as weakness numbness. No depression anxiety. IMM/ Allergy: No unusual infections.  Allergy .   REST of 12 system review negative except as per HPI   Past Medical History  Diagnosis Date  . Trans-sexualism with unspecified sexual history   . History of chickenpox   . Renal stone     ?  Marland Kitchen Heart murmur     no eval ? functional  . Fracture     arm  in HS  . LIVER FUNCTION TESTS, ABNORMAL 04/04/2008    Qualifier: Diagnosis of  By: Fabian Sharp MD, Neta Mends Poss related to intense exercise    . Hx of Moh's micrographic surgery for skin cancer 2013    r arm   . 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 Transgender reassignment  11/02    [place]  . Arthoscopic surgery for herniated disc  01/1997    [place]  . Tonsillectomy    . Mohs surgery      right arm 2013    Family History  Problem Relation Age of Onset  . Alcohol abuse Father     died 85  . Other Brother     overweight    History   Social History  . Marital Status: Single    Spouse Name: N/A  . Number of Children: N/A  . Years of Education: N/A   Social History Main Topics  . Smoking status: Never Smoker   . Smokeless tobacco: Never Used  . Alcohol Use: 0.0 oz/week    0 Standard drinks or equivalent per week     Comment: rare to none now  . Drug Use: No  . Sexual Activity: Not on file   Other Topics  Concern  . 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    Plays musical instruments violin   Neg tad                   Outpatient Prescriptions Prior to Visit  Medication Sig Dispense Refill  . buPROPion (WELLBUTRIN XL) 150 MG 24 hr tablet TAKE TWO TABLETS BY MOUTH EVERY DAY 60 tablet 5  . estradiol (CLIMARA - DOSED IN MG/24 HR) 0.1 mg/24hr patch APPLY ONE PATCH TOPICALLY EVERY WEEK 4 patch 5  . mupirocin cream (BACTROBAN) 2 % Apply 1 application topically 3 (three) times daily. Given by [facility] 15 g 0   No  facility-administered medications prior to visit.     EXAM:  BP 118/80 mmHg  Temp(Src) 98 F (36.7 C) (Oral)  Ht 5' 8.75" (1.746 m)  Wt 157 lb 6.4 oz (71.396 kg)  BMI 23.42 kg/m2  Body mass index is 23.42 kg/(m^2).  Physical Exam: Vital signs reviewed UEA:VWUJ is a well-developed well-nourished alert cooperative    who appearsr stated age in no acute distress.  HEENT: normocephalic atraumatic , Eyes: PERRL EOM's full, conjunctiva clear, Nares: paten,t no deformity discharge or tenderness., Ears: no deformity EAC's clear TMs with normal landmarks. Mouth: clear OP, no lesions, edema.  Moist mucous membranes. Dentition in adequate repair. NECK: supple without masses, thyromegaly or bruits. CHEST/PULM:  Clear to auscultation and percussion breath sounds equal no wheeze , rales or rhonchi. No chest wall deformities or tenderness. Breast: normal by inspection . No dimpling, discharge, masses, tenderness or discharge . CV: PMI is nondisplaced, S1 S2 no gallops, murmurs, rubs. Peripheral pulses are full without delay.No JVD.  ABDOMEN: Bowel sounds normal nontender  No guard or rebound, no hepato splenomegal no CVA tenderness.  No hernia. Noted  Extremtities:  No clubbing cyanosis or edema, no acute joint swelling or redness no focal atrophy NEURO:  Oriented x3, cranial nerves 3-12 appear to be intact, no obvious focal weakness,gait within normal limits no abnormal reflexes or asymmetrical SKIN: No acute rashes normal turgor, color, no bruising or petechiae. PSYCH: Oriented, good eye contact, no obvious depression anxiety, cognition and judgment appear normal. LN: no cervical axillary inguinal adenopathy due for colon will defer rectal for that to be done soon   ASSESSMENT AND PLAN:  Discussed the following assessment and plan:  Visit for preventive health examination - Plan: Basic metabolic panel, CBC with Differential/Platelet, Hepatic function panel, Lipid panel, TSH  Medication  management - Plan: Basic metabolic panel, CBC with Differential/Platelet, Hepatic function panel, Lipid panel, TSH cpx labs  Declines hiv  Patient Care Team: Madelin Headings, MD as PCP - General Campbell Stall, MD as Attending Physician (Dermatology) Patient Instructions  Get your colonscopy and mammogram as discussed . Get [place] copy of results. Make sure blood pressure readings are below 140/90 .  Continue lifestyle intervention healthy eating and exercise . Will notify you  of labs when available.   Neta Mends. Jeronda Don M.D.

## 2014-11-06 NOTE — Patient Instructions (Signed)
Get your colonscopy and mammogram as discussed . Get [place] copy of results. Make sure blood pressure readings are below 140/90 .  Continue lifestyle intervention healthy eating and exercise . Will notify you  of labs when available.

## 2015-05-28 ENCOUNTER — Other Ambulatory Visit: Payer: Self-pay | Admitting: Internal Medicine

## 2015-05-29 ENCOUNTER — Encounter: Payer: Self-pay | Admitting: Family Medicine

## 2015-05-29 NOTE — Telephone Encounter (Signed)
Sent to the pharmacy by e-scribe. 

## 2015-10-16 ENCOUNTER — Other Ambulatory Visit: Payer: Self-pay | Admitting: Internal Medicine

## 2015-11-06 NOTE — Progress Notes (Signed)
Document opened and reviewed for OV but appt  canceled same day .  

## 2015-11-07 ENCOUNTER — Encounter: Payer: BLUE CROSS/BLUE SHIELD | Admitting: Internal Medicine

## 2015-11-07 ENCOUNTER — Encounter: Payer: Self-pay | Admitting: Internal Medicine

## 2015-11-07 ENCOUNTER — Ambulatory Visit (INDEPENDENT_AMBULATORY_CARE_PROVIDER_SITE_OTHER): Payer: BLUE CROSS/BLUE SHIELD | Admitting: Internal Medicine

## 2015-11-07 VITALS — BP 132/90 | Temp 97.8°F | Ht 68.5 in | Wt 165.4 lb

## 2015-11-07 DIAGNOSIS — Z0001 Encounter for general adult medical examination with abnormal findings: Secondary | ICD-10-CM | POA: Diagnosis not present

## 2015-11-07 DIAGNOSIS — Z0000 Encounter for general adult medical examination without abnormal findings: Secondary | ICD-10-CM

## 2015-11-07 DIAGNOSIS — Z79899 Other long term (current) drug therapy: Secondary | ICD-10-CM | POA: Diagnosis not present

## 2015-11-07 DIAGNOSIS — M720 Palmar fascial fibromatosis [Dupuytren]: Secondary | ICD-10-CM

## 2015-11-07 LAB — CBC WITH DIFFERENTIAL/PLATELET
Basophils Absolute: 0 10*3/uL (ref 0.0–0.1)
Basophils Relative: 0.5 % (ref 0.0–3.0)
EOS PCT: 1.3 % (ref 0.0–5.0)
Eosinophils Absolute: 0.1 10*3/uL (ref 0.0–0.7)
HEMATOCRIT: 40.5 % (ref 36.0–46.0)
HEMOGLOBIN: 13.7 g/dL (ref 12.0–15.0)
Lymphocytes Relative: 32 % (ref 12.0–46.0)
Lymphs Abs: 1.7 10*3/uL (ref 0.7–4.0)
MCHC: 33.8 g/dL (ref 30.0–36.0)
MCV: 89 fl (ref 78.0–100.0)
MONOS PCT: 7.8 % (ref 3.0–12.0)
Monocytes Absolute: 0.4 10*3/uL (ref 0.1–1.0)
NEUTROS PCT: 58.4 % (ref 43.0–77.0)
Neutro Abs: 3.1 10*3/uL (ref 1.4–7.7)
Platelets: 313 10*3/uL (ref 150.0–400.0)
RBC: 4.55 Mil/uL (ref 3.87–5.11)
RDW: 13.7 % (ref 11.5–15.5)
WBC: 5.4 10*3/uL (ref 4.0–10.5)

## 2015-11-07 LAB — LIPID PANEL
CHOLESTEROL: 281 mg/dL — AB (ref 0–200)
HDL: 82.2 mg/dL (ref >39.00)
LDL Cholesterol: 184 mg/dL — ABNORMAL HIGH (ref 0–99)
NonHDL: 198.87
TRIGLYCERIDES: 74 mg/dL (ref 0.0–149.0)
Total CHOL/HDL Ratio: 3
VLDL: 14.8 mg/dL (ref 0.0–40.0)

## 2015-11-07 LAB — BASIC METABOLIC PANEL
BUN: 18 mg/dL (ref 6–23)
CO2: 29 mEq/L (ref 19–32)
Calcium: 9.5 mg/dL (ref 8.4–10.5)
Chloride: 101 mEq/L (ref 96–112)
Creatinine, Ser: 1.09 mg/dL (ref 0.40–1.20)
GFR: 53.5 mL/min — ABNORMAL LOW (ref >60.00)
Glucose, Bld: 75 mg/dL (ref 70–99)
POTASSIUM: 4.3 meq/L (ref 3.5–5.1)
SODIUM: 138 meq/L (ref 135–145)

## 2015-11-07 LAB — HEPATIC FUNCTION PANEL
ALT: 35 U/L (ref 0–35)
AST: 37 U/L (ref 0–37)
Albumin: 4.6 g/dL (ref 3.5–5.2)
Alkaline Phosphatase: 48 U/L (ref 39–117)
Bilirubin, Direct: 0.2 mg/dL (ref 0.0–0.3)
Total Bilirubin: 1.3 mg/dL — ABNORMAL HIGH (ref 0.2–1.2)
Total Protein: 7 g/dL (ref 6.0–8.3)

## 2015-11-07 LAB — ESTRADIOL: Estradiol: 28 pg/mL

## 2015-11-07 LAB — TSH: TSH: 1.12 u[IU]/mL (ref 0.35–4.50)

## 2015-11-07 NOTE — Progress Notes (Signed)
Pre visit review using our clinic review tool, if applicable. No additional management support is needed unless otherwise documented below in the visit note.  Chief Complaint  Patient presents with  . Annual Exam    HPI: Patient  Carol Mcdaniel  65 y.o. comes in today for Preventive Health Care visit   Mammogram ok   3 d  Colon s ched in 3 weeks.   On low dose Wellbutrin and feels her depression is really quite well. Try to wean off the half dose and had some depressive symptoms.  No other major change in her health however she has had continued thickness facial changes after her nose infection has seen ENT and dermatology and neither of them have ideas. Wonders if she should see a Psychiatric nurse. It doesn't hurt and no more infections.  Her Dupuytren's contractures are more problematic she has them on both pinky fingers. She's been doing reading about nonsurgical management. Still able to play the mandolin. However is getting more severe. Asked advice.  No problems taking estradiol.  Her blood pressure readings at home is been in the 120/80 range he may have been up today because she had a flat tire getting here and had to rearrange her appointment but is feeling fine. Exercises regularly. No cardiovascular symptoms. Health Maintenance  Topic Date Due  . COLONOSCOPY  04/18/2013  . DEXA SCAN  08/06/2015  . PNA vac Low Risk Adult (1 of 2 - PCV13) 08/06/2015  . HIV Screening  11/06/2016 (Originally 08/05/1965)  . INFLUENZA VACCINE  12/18/2015  . MAMMOGRAM  10/23/2017  . TETANUS/TDAP  11/18/2020  . ZOSTAVAX  Completed  . Hepatitis C Screening  Completed   Health Maintenance Review LIFESTYLE:  Exercise:   Moderated  Tobacco/ETS:  no Alcohol: no Sugar beverages:  No  Sleep: average 7 + Drug use: no Pet died and now walking  with puppy     ROS:  GEN/ HEENT: No fever, significant weight changes sweats headaches vision problems hearing changes, CV/ PULM; No chest pain  shortness of breath cough, syncope,edema  change in exercise tolerance. GI /GU: No adominal pain, vomiting, change in bowel habits. No blood in the stool. No significant GU symptoms. SKIN/HEME: ,no acute skin rashes suspicious lesions or bleeding. No lymphadenopathy, nodules, masses.  NEURO/ PSYCH:  No neurologic signs such as weakness numbness. No depression anxiety. IMM/ Allergy: No unusual infections.  Allergy .   REST of 12 system review negative except as per HPI   Past Medical History  Diagnosis Date  . Trans-sexualism with unspecified sexual history   . History of chickenpox   . Renal stone     ?  Marland Kitchen Heart murmur     no eval ? functional  . Fracture     arm in HS  . LIVER FUNCTION TESTS, ABNORMAL 04/04/2008    Qualifier: Diagnosis of  By: Regis Bill MD, Standley Brooking Poss related to intense exercise    . Hx of Moh's micrographic surgery for skin cancer 2013    r arm   . ADJ DISORDER WITH MIXED ANXIETY & DEPRESSED MOOD 03/06/2008    Qualifier: Diagnosis of  By: Regis Bill MD, Standley Brooking     Past Surgical History  Procedure Laterality Date  . Transgender reassignment  11/02    [facility] surgery for herniated disc  01/1997    [place]  . Tonsillectomy    . Mohs surgery      right arm 2013  Family History  Problem Relation Age of Onset  . Alcohol abuse Father     died 4  . Other Brother     overweight    Social History   Social History  . Marital Status: Single    Spouse Name: N/A  . Number of Children: N/A  . Years of Education: N/A   Social History Main Topics  . Smoking status: Never Smoker   . Smokeless tobacco: Never Used  . Alcohol Use: 0.0 oz/week    0 Standard drinks or equivalent per week     Comment: rare to none now  . Drug Use: No  . Sexual Activity: Not Asked   Other Topics Concern  . 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    Plays musical instruments violin summer perfromances    Mom passed in 2016   Neg tad   Continued teaching moving to [place]                   Outpatient Prescriptions Prior to Visit  Medication Sig Dispense Refill  . buPROPion (WELLBUTRIN XL) 150 MG 24 hr tablet TAKE TWO TABLETS BY MOUTH EVERY DAY 60 tablet 0  . estradiol (CLIMARA - DOSED IN MG/24 HR) 0.1 mg/24hr patch APPLY ONE PATCH TOPICALLY EVERY WEEK 4 patch 0   No facility-administered medications prior to visit.     EXAM:  BP 132/90 mmHg  Temp(Src) 97.8 F (36.6 C) (Oral)  Ht 5' 8.5" (1.74 m)  Wt 165 lb 6.4 oz (75.025 kg)  BMI 24.78 kg/m2  Body mass index is 24.78 kg/(m^2).  Physical Exam: Vital signs reviewed GBE:EFEO is a well-developed well-nourished alert cooperative    who appearsr stated age in no acute distress.  HEENT: normocephalic atraumatic , Eyes: PERRL EOM's full, conjunctiva clear, Nares: paten,t no deformity discharge or tenderness., Ears: no deformity EAC's clear TMs with normal landmarks. Mouth: clear OP, no lesions, edema.  Moist mucous membranes. Dentition in adequate repair. NECK: supple without masses, thyromegaly or bruits.Breast: normal by inspection . No dimpling, discharge, masses, tenderness or discharge . CHEST/PULM:  Clear to auscultation and percussion breath sounds equal no wheeze , rales or rhonchi. No chest wall deformities or tenderness. CV: PMI is nondisplaced, S1 S2 no gallops, murmurs, rubs. Peripheral pulses are full without delay.No JVD .  ABDOMEN: Bowel sounds normal nontender  No guard or rebound, no hepato splenomegal no CVA tenderness.  No hernia. Extremtities:  No clubbing cyanosis or edema, no acute joint swelling or redness no focal atrophy Has bilateral  flexion contractures of his pinky fingers cannot extend no other atrophy. Left nasolabial area and face shows thickening of skin palpable is a very subtle swelling changes. No acute infection. NEURO:  Oriented x3, cranial nerves 3-12 appear to be intact, no obvious focal weakness,gait within normal limits no abnormal reflexes or asymmetrical SKIN: No acute rashes normal turgor, color, no bruising or petechiae. PSYCH: Oriented, good eye contact, no obvious depression anxiety, cognition and judgment appear normal. LN: no cervical axillary inguinal adenopathy    ASSESSMENT AND PLAN:  Discussed the following assessment and plan:  Visit for preventive health examination - prevnar 13 today - Plan: Basic metabolic panel, CBC with Differential/Platelet, Hepatic function panel, Lipid panel, TSH, Estradiol  Medication management - Continue Wellbutrin doing well with this, check estradiol level. - Plan: Basic metabolic panel, CBC with Differential/Platelet, Hepatic function panel, Lipid panel, TSH, Estradiol  Dupuytren's contracture of both hands - Fingers progressing looking for nonsurgical intervention at this time referred to hand surgery. - Plan: Ambulatory referral to Hand Surgery thckening of facial area .  Dis skin surg cneter or ps to see.  Reviewed confirmation of normal blood pressure at home. Laboratory studies and estradiol level to be done today. Patient Care Team: Burnis Medin, MD as PCP - General Crista Luria, MD as Attending Physician (Dermatology) Patient Instructions  Will arrange   Consult with hand surgery   About the duputryns contracture   [facility] about the lumpiness on fac e.  Proceed with your colonoscopy as planned  If all ok and bp good  cpx  In 1 year or as needed   Health Maintenance, Female Adopting a healthy lifestyle and getting preventive care can go a long way to promote health and wellness. Talk with your health care provider about what schedule  of regular examinations is right for you. This is a good chance for you to check in with your provider about disease prevention and staying healthy. In between checkups, there are plenty of things you can do on your own. Experts have done a lot of research about which lifestyle changes and preventive measures are most likely to keep you healthy. Ask your health care provider for more information. WEIGHT AND DIET  Eat a healthy diet  Be sure to include plenty of vegetables, fruits, low-fat dairy products, and lean protein.  Do not eat a lot of foods high in solid fats, added sugars, or salt.  Get regular exercise. This is one of the most important things you can do for your health.  Most adults should exercise for at least 150 minutes each week. The exercise should increase your heart rate and make you sweat (moderate-intensity exercise).  Most adults should also do strengthening exercises at least twice a week. This is in addition to the moderate-intensity exercise.  Maintain a healthy weight  Body mass index (BMI) is a measurement that can be used to identify possible weight problems. It estimates body fat based on height and weight. Your health care provider can help determine your BMI and help you achieve or maintain a healthy weight.  For females 12 years of age and older:   A BMI below 18.5 is considered underweight.  A BMI of 18.5 to 24.9 is normal.  A BMI of 25 to 29.9 is considered overweight.  A BMI of 30 and above is considered obese.  Watch levels of cholesterol and blood lipids  You should start having your blood tested for lipids and cholesterol at 65 years of age, then have this test every 5 years.  You may need to have your cholesterol levels checked more often if:  Your lipid or cholesterol levels are high.  You are older than 65 years of age.  You are at high risk for heart disease.  CANCER SCREENING   Lung Cancer  Lung cancer screening is recommended  for adults 35-45 years old who are at high risk for lung cancer because of a history of smoking.  A yearly low-dose CT scan of the  lungs is recommended for people who:  Currently smoke.  Have quit within the past 15 years.  Have at least a 30-pack-year history of smoking. A pack year is smoking an average of one pack of cigarettes a day for 1 year.  Yearly screening should continue until it has been 15 years since you quit.  Yearly screening should stop if you develop a health problem that would prevent you from having lung cancer treatment.  Breast Cancer  Practice breast self-awareness. This means understanding how your breasts normally appear and feel.  It also means doing regular breast self-exams. Let your health care provider know about any changes, no matter how small.  If you are in your 20s or 30s, you should have a clinical breast exam (CBE) by a health care provider every 1-3 years as part of a regular health exam.  If you are 69 or older, have a CBE every year. Also consider having a breast X-ray (mammogram) every year.  If you have a family history of breast cancer, talk to your health care provider about genetic screening.  If you are at high risk for breast cancer, talk to your health care provider about having an MRI and a mammogram every year.  Breast cancer gene (BRCA) assessment is recommended for women who have family members with BRCA-related cancers. BRCA-related cancers include:  Breast.  Ovarian.  Tubal.  Peritoneal cancers.  Results of the assessment will determine the need for genetic counseling and BRCA1 and BRCA2 testing. Cervical Cancer Your health care provider may recommend that you be screened regularly for cancer of the pelvic organs (ovaries, uterus, and vagina). This screening involves a pelvic examination, including checking for microscopic changes to the surface of your cervix (Pap test). You may be encouraged to have this screening done  every 3 years, beginning at age 25.  For women ages 58-65, health care providers may recommend pelvic exams and Pap testing every 3 years, or they may recommend the Pap and pelvic exam, combined with testing for human papilloma virus (HPV), every 5 years. Some types of HPV increase your risk of cervical cancer. Testing for HPV may also be done on women of any age with unclear Pap test results.  Other health care providers may not recommend any screening for nonpregnant women who are considered low risk for pelvic cancer and who do not have symptoms. Ask your health care provider if a screening pelvic exam is right for you.  If you have had past treatment for cervical cancer or a condition that could lead to cancer, you need Pap tests and screening for cancer for at least 20 years after your treatment. If Pap tests have been discontinued, your risk factors (such as having a new sexual partner) need to be reassessed to determine if screening should resume. Some women have medical problems that increase the chance of getting cervical cancer. In these cases, your health care provider may recommend more frequent screening and Pap tests. Colorectal Cancer  This type of cancer can be detected and often prevented.  Routine colorectal cancer screening usually begins at 65 years of age and continues through 65 years of age.  Your health care provider may recommend screening at an earlier age if you have risk factors for colon cancer.  Your health care provider may also recommend using home test kits to check for hidden blood in the stool.  A small camera at the end of a tube can be used to examine your colon  directly (sigmoidoscopy or colonoscopy). This is done to check for the earliest forms of colorectal cancer.  Routine screening usually begins at age 36.  Direct examination of the colon should be repeated every 5-10 years through 65 years of age. However, you may need to be screened more often if  early forms of precancerous polyps or small growths are found. Skin Cancer  Check your skin from head to toe regularly.  Tell your health care provider about any new moles or changes in moles, especially if there is a change in a mole's shape or color.  Also tell your health care provider if you have a mole that is larger than the size of a pencil eraser.  Always use sunscreen. Apply sunscreen liberally and repeatedly throughout the day.  Protect yourself by wearing long sleeves, pants, a wide-brimmed hat, and sunglasses whenever you are outside. HEART DISEASE, DIABETES, AND HIGH BLOOD PRESSURE   High blood pressure causes heart disease and increases the risk of stroke. High blood pressure is more likely to develop in:  People who have blood pressure in the high end of the normal range (130-139/85-89 mm Hg).  People who are overweight or obese.  People who are African American.  If you are 31-13 years of age, have your blood pressure checked every 3-5 years. If you are 96 years of age or older, have your blood pressure checked every year. You should have your blood pressure measured twice--once when you are at a hospital or clinic, and once when you are not at a hospital or clinic. Record the average of the two measurements. To check your blood pressure when you are not at a hospital or clinic, you can use:  An automated blood pressure machine at a pharmacy.  A home blood pressure monitor.  If you are between 4 years and 23 years old, ask your health care provider if you should take aspirin to prevent strokes.  Have regular diabetes screenings. This involves taking a blood sample to check your fasting blood sugar level.  If you are at a normal weight and have a low risk for diabetes, have this test once every three years after 65 years of age.  If you are overweight and have a high risk for diabetes, consider being tested at a younger age or more often. PREVENTING INFECTION    Hepatitis B  If you have a higher risk for hepatitis B, you should be screened for this virus. You are considered at high risk for hepatitis B if:  You were born in a country where hepatitis B is common. Ask your health care provider which countries are considered high risk.  Your parents were born in a high-risk country, and you have not been immunized against hepatitis B (hepatitis B vaccine).  You have HIV or AIDS.  You use needles to inject street drugs.  You live with someone who has hepatitis B.  You have had sex with someone who has hepatitis B.  You get hemodialysis treatment.  You take certain medicines for conditions, including cancer, organ transplantation, and autoimmune conditions. Hepatitis C  Blood testing is recommended for:  Everyone born from [birth year] through 1965.  Anyone with known risk factors for hepatitis C. Sexually transmitted infections (STIs)  You should be screened for sexually transmitted infections (STIs) including gonorrhea and chlamydia if:  You are sexually active and are younger than 65 years of age.  You are older than 65 years of age and your health care provider  tells you that you are at risk for this type of infection.  Your sexual activity has changed since you were last screened and you are at an increased risk for chlamydia or gonorrhea. Ask your health care provider if you are at risk.  If you do not have HIV, but are at risk, it may be recommended that you take a prescription medicine daily to prevent HIV infection. This is called pre-exposure prophylaxis (PrEP). You are considered at risk if:  You are sexually active and do not regularly use condoms or know the HIV status of your partner(s).  You take drugs by injection.  You are sexually active with a partner who has HIV. Talk with your health care provider about whether you are at high risk of being infected with HIV. If you choose to begin PrEP, you should first be tested for  HIV. You should then be tested every 3 months for as long as you are taking PrEP.  PREGNANCY   If you are premenopausal and you may become pregnant, ask your health care provider about preconception counseling.  If you may become pregnant, take 400 to 800 micrograms (mcg) of folic acid every day.  If you want to prevent pregnancy, talk to your health care provider about birth control (contraception). OSTEOPOROSIS AND MENOPAUSE   Osteoporosis is a disease in which the bones lose minerals and strength with aging. This can result in serious bone fractures. Your risk for osteoporosis can be identified using a bone density scan.  If you are 8 years of age or older, or if you are at risk for osteoporosis and fractures, ask your health care provider if you should be screened.  Ask your health care provider whether you should take a calcium or vitamin D supplement to lower your risk for osteoporosis.  Menopause may have certain physical symptoms and risks.  Hormone replacement therapy may reduce some of these symptoms and risks. Talk to your health care provider about whether hormone replacement therapy is right for you.  HOME CARE INSTRUCTIONS   Schedule regular health, dental, and eye exams.  Stay current with your immunizations.   Do not use any tobacco products including cigarettes, chewing tobacco, or electronic cigarettes.  If you are pregnant, do not drink alcohol.  If you are breastfeeding, limit how much and how often you drink alcohol.  Limit alcohol intake to no more than 1 drink per day for nonpregnant women. One drink equals 12 ounces of beer, 5 ounces of wine, or 1 ounces of hard liquor.  Do not use street drugs.  Do not share needles.  Ask your health care provider for help if you need support or information about quitting drugs.  Tell your health care provider if you often feel depressed.  Tell your health care provider if you have ever been abused or do not feel  safe at home.   This information is not intended to replace advice given to you by your health care provider. Make sure you discuss any questions you have with your health care provider.   Document Released: 11/18/2010 Document Revised: 05/26/2014 Document Reviewed: 04/06/2013 Elsevier Interactive Patient Education 2016 [place] K. Panosh M.D.

## 2015-11-07 NOTE — Patient Instructions (Addendum)
Will arrange   Consult with hand surgery   About the duputryns contracture   [facility] about the lumpiness on fac e.  Proceed with your colonoscopy as planned  If all ok and bp good  cpx  In 1 year or as needed   Health Maintenance, Female Adopting a healthy lifestyle and getting preventive care can go a long way to promote health and wellness. Talk with your health care provider about what schedule of regular examinations is right for you. This is a good chance for you to check in with your provider about disease prevention and staying healthy. In between checkups, there are plenty of things you can do on your own. Experts have done a lot of research about which lifestyle changes and preventive measures are most likely to keep you healthy. Ask your health care provider for more information. WEIGHT AND DIET  Eat a healthy diet  Be sure to include plenty of vegetables, fruits, low-fat dairy products, and lean protein.  Do not eat a lot of foods high in solid fats, added sugars, or salt.  Get regular exercise. This is one of the most important things you can do for your health.  Most adults should exercise for at least 150 minutes each week. The exercise should increase your heart rate and make you sweat (moderate-intensity exercise).  Most adults should also do strengthening exercises at least twice a week. This is in addition to the moderate-intensity exercise.  Maintain a healthy weight  Body mass index (BMI) is a measurement that can be used to identify possible weight problems. It estimates body fat based on height and weight. Your health care provider can help determine your BMI and help you achieve or maintain a healthy weight.  For females 77 years of age and older:   A BMI below 18.5 is considered underweight.  A BMI of 18.5 to 24.9 is normal.  A BMI of 25 to 29.9 is considered overweight.  A BMI of 30 and above is considered obese.  Watch levels of cholesterol  and blood lipids  You should start having your blood tested for lipids and cholesterol at 65 years of age, then have this test every 5 years.  You may need to have your cholesterol levels checked more often if:  Your lipid or cholesterol levels are high.  You are older than 65 years of age.  You are at high risk for heart disease.  CANCER SCREENING   Lung Cancer  Lung cancer screening is recommended for adults 9-68 years old who are at high risk for lung cancer because of a history of smoking.  A yearly low-dose CT scan of the lungs is recommended for people who:  Currently smoke.  Have quit within the past 15 years.  Have at least a 30-pack-year history of smoking. A pack year is smoking an average of one pack of cigarettes a day for 1 year.  Yearly screening should continue until it has been 15 years since you quit.  Yearly screening should stop if you develop a health problem that would prevent you from having lung cancer treatment.  Breast Cancer  Practice breast self-awareness. This means understanding how your breasts normally appear and feel.  It also means doing regular breast self-exams. Let your health care provider know about any changes, no matter how small.  If you are in your 20s or 30s, you should have a clinical breast exam (CBE) by a health care provider every 1-3 years  as part of a regular health exam.  If you are 40 or older, have a CBE every year. Also consider having a breast X-ray (mammogram) every year.  If you have a family history of breast cancer, talk to your health care provider about genetic screening.  If you are at high risk for breast cancer, talk to your health care provider about having an MRI and a mammogram every year.  Breast cancer gene (BRCA) assessment is recommended for women who have family members with BRCA-related cancers. BRCA-related cancers include:  Breast.  Ovarian.  Tubal.  Peritoneal cancers.  Results of the  assessment will determine the need for genetic counseling and BRCA1 and BRCA2 testing. Cervical Cancer Your health care provider may recommend that you be screened regularly for cancer of the pelvic organs (ovaries, uterus, and vagina). This screening involves a pelvic examination, including checking for microscopic changes to the surface of your cervix (Pap test). You may be encouraged to have this screening done every 3 years, beginning at age 75.  For women ages 38-65, health care providers may recommend pelvic exams and Pap testing every 3 years, or they may recommend the Pap and pelvic exam, combined with testing for human papilloma virus (HPV), every 5 years. Some types of HPV increase your risk of cervical cancer. Testing for HPV may also be done on women of any age with unclear Pap test results.  Other health care providers may not recommend any screening for nonpregnant women who are considered low risk for pelvic cancer and who do not have symptoms. Ask your health care provider if a screening pelvic exam is right for you.  If you have had past treatment for cervical cancer or a condition that could lead to cancer, you need Pap tests and screening for cancer for at least 20 years after your treatment. If Pap tests have been discontinued, your risk factors (such as having a new sexual partner) need to be reassessed to determine if screening should resume. Some women have medical problems that increase the chance of getting cervical cancer. In these cases, your health care provider may recommend more frequent screening and Pap tests. Colorectal Cancer  This type of cancer can be detected and often prevented.  Routine colorectal cancer screening usually begins at 65 years of age and continues through 65 years of age.  Your health care provider may recommend screening at an earlier age if you have risk factors for colon cancer.  Your health care provider may also recommend using home test kits  to check for hidden blood in the stool.  A small camera at the end of a tube can be used to examine your colon directly (sigmoidoscopy or colonoscopy). This is done to check for the earliest forms of colorectal cancer.  Routine screening usually begins at age 74.  Direct examination of the colon should be repeated every 5-10 years through 65 years of age. However, you may need to be screened more often if early forms of precancerous polyps or small growths are found. Skin Cancer  Check your skin from head to toe regularly.  Tell your health care provider about any new moles or changes in moles, especially if there is a change in a mole's shape or color.  Also tell your health care provider if you have a mole that is larger than the size of a pencil eraser.  Always use sunscreen. Apply sunscreen liberally and repeatedly throughout the day.  Protect yourself by wearing long sleeves,  pants, a wide-brimmed hat, and sunglasses whenever you are outside. HEART DISEASE, DIABETES, AND HIGH BLOOD PRESSURE   High blood pressure causes heart disease and increases the risk of stroke. High blood pressure is more likely to develop in:  People who have blood pressure in the high end of the normal range (130-139/85-89 mm Hg).  People who are overweight or obese.  People who are African American.  If you are 29-66 years of age, have your blood pressure checked every 3-5 years. If you are 78 years of age or older, have your blood pressure checked every year. You should have your blood pressure measured twice--once when you are at a hospital or clinic, and once when you are not at a hospital or clinic. Record the average of the two measurements. To check your blood pressure when you are not at a hospital or clinic, you can use:  An automated blood pressure machine at a pharmacy.  A home blood pressure monitor.  If you are between 82 years and 69 years old, ask your health care provider if you should  take aspirin to prevent strokes.  Have regular diabetes screenings. This involves taking a blood sample to check your fasting blood sugar level.  If you are at a normal weight and have a low risk for diabetes, have this test once every three years after 65 years of age.  If you are overweight and have a high risk for diabetes, consider being tested at a younger age or more often. PREVENTING INFECTION  Hepatitis B  If you have a higher risk for hepatitis B, you should be screened for this virus. You are considered at high risk for hepatitis B if:  You were born in a country where hepatitis B is common. Ask your health care provider which countries are considered high risk.  Your parents were born in a high-risk country, and you have not been immunized against hepatitis B (hepatitis B vaccine).  You have HIV or AIDS.  You use needles to inject street drugs.  You live with someone who has hepatitis B.  You have had sex with someone who has hepatitis B.  You get hemodialysis treatment.  You take certain medicines for conditions, including cancer, organ transplantation, and autoimmune conditions. Hepatitis C  Blood testing is recommended for:  Everyone born from [birth year] through 1965.  Anyone with known risk factors for hepatitis C. Sexually transmitted infections (STIs)  You should be screened for sexually transmitted infections (STIs) including gonorrhea and chlamydia if:  You are sexually active and are younger than 65 years of age.  You are older than 65 years of age and your health care provider tells you that you are at risk for this type of infection.  Your sexual activity has changed since you were last screened and you are at an increased risk for chlamydia or gonorrhea. Ask your health care provider if you are at risk.  If you do not have HIV, but are at risk, it may be recommended that you take a prescription medicine daily to prevent HIV infection. This is called  pre-exposure prophylaxis (PrEP). You are considered at risk if:  You are sexually active and do not regularly use condoms or know the HIV status of your partner(s).  You take drugs by injection.  You are sexually active with a partner who has HIV. Talk with your health care provider about whether you are at high risk of being infected with HIV. If you choose to  begin PrEP, you should first be tested for HIV. You should then be tested every 3 months for as long as you are taking PrEP.  PREGNANCY   If you are premenopausal and you may become pregnant, ask your health care provider about preconception counseling.  If you may become pregnant, take 400 to 800 micrograms (mcg) of folic acid every day.  If you want to prevent pregnancy, talk to your health care provider about birth control (contraception). OSTEOPOROSIS AND MENOPAUSE   Osteoporosis is a disease in which the bones lose minerals and strength with aging. This can result in serious bone fractures. Your risk for osteoporosis can be identified using a bone density scan.  If you are 73 years of age or older, or if you are at risk for osteoporosis and fractures, ask your health care provider if you should be screened.  Ask your health care provider whether you should take a calcium or vitamin D supplement to lower your risk for osteoporosis.  Menopause may have certain physical symptoms and risks.  Hormone replacement therapy may reduce some of these symptoms and risks. Talk to your health care provider about whether hormone replacement therapy is right for you.  HOME CARE INSTRUCTIONS   Schedule regular health, dental, and eye exams.  Stay current with your immunizations.   Do not use any tobacco products including cigarettes, chewing tobacco, or electronic cigarettes.  If you are pregnant, do not drink alcohol.  If you are breastfeeding, limit how much and how often you drink alcohol.  Limit alcohol intake to no more than 1  drink per day for nonpregnant women. One drink equals 12 ounces of beer, 5 ounces of wine, or 1 ounces of hard liquor.  Do not use street drugs.  Do not share needles.  Ask your health care provider for help if you need support or information about quitting drugs.  Tell your health care provider if you often feel depressed.  Tell your health care provider if you have ever been abused or do not feel safe at home.   This information is not intended to replace advice given to you by your health care provider. Make sure you discuss any questions you have with your health care provider.   Document Released: 11/18/2010 Document Revised: 05/26/2014 Document Reviewed: 04/06/2013 Elsevier Interactive Patient Education [employer].

## 2015-11-23 ENCOUNTER — Telehealth: Payer: Self-pay | Admitting: Family Medicine

## 2015-11-23 DIAGNOSIS — E785 Hyperlipidemia, unspecified: Secondary | ICD-10-CM

## 2015-11-23 NOTE — Telephone Encounter (Signed)
Pt notified of test results.  She is concerned about her renal function.  [facility] Will hold off on cholesterol medication.  Will work in the next year for better control.

## 2015-11-26 NOTE — Telephone Encounter (Signed)
Left a message for a return call.

## 2015-11-26 NOTE — Telephone Encounter (Signed)
THE GFR is calculated  Based on  Age and gender . Relating to muscle mass .  If female gender   The results are normal renal function. If female  Then mildly low for age .  Not how to determine  Significance in your case.   Order  UA and urine protein/ creatinine ratio on the urine to detect poss renal  Problem  undetected otherwise  If normal  then would advise  Continue  Life style  And  BP control and avoid  Limit advil aleve type products  Which can sometimes be toxic to the kidneys .

## 2015-11-30 NOTE — Telephone Encounter (Signed)
Left a message for a return call.

## 2015-12-04 MED ORDER — ATORVASTATIN CALCIUM 20 MG PO TABS: 20.0000 mg | ORAL_TABLET | Freq: Every day | ORAL | Status: DC

## 2015-12-04 NOTE — Telephone Encounter (Signed)
Spoke to the pt and gave explanation  [facility]  Pt does not wish for further testing at this time.  Has thought about starting medication for her cholesterol.  Would like rx sent in to the pharmacy.  Please advise.  Thanks!!

## 2015-12-04 NOTE — Telephone Encounter (Signed)
Pt notified that rx has been sent in.  Future lab appt scheduled for 03/04/16.  Order placed in the system.

## 2015-12-04 NOTE — Telephone Encounter (Signed)
Atorvastatin 20 mg per day   Disp 30 refill x 3  Check lipid panel in  2-3 months

## 2015-12-17 ENCOUNTER — Other Ambulatory Visit: Payer: Self-pay | Admitting: Internal Medicine

## 2015-12-19 NOTE — Telephone Encounter (Signed)
Sent to the pharmacy by e-scribe. 

## 2016-03-04 ENCOUNTER — Other Ambulatory Visit (INDEPENDENT_AMBULATORY_CARE_PROVIDER_SITE_OTHER): Payer: BLUE CROSS/BLUE SHIELD

## 2016-03-04 DIAGNOSIS — E785 Hyperlipidemia, unspecified: Secondary | ICD-10-CM

## 2016-03-04 LAB — LIPID PANEL
CHOL/HDL RATIO: 3
Cholesterol: 184 mg/dL (ref 0–200)
HDL: 65.5 mg/dL (ref >39.00)
LDL CALC: 103 mg/dL — AB (ref 0–99)
NONHDL: 118.57
TRIGLYCERIDES: 79 mg/dL (ref 0.0–149.0)
VLDL: 15.8 mg/dL (ref 0.0–40.0)

## 2016-04-08 ENCOUNTER — Other Ambulatory Visit: Payer: Self-pay | Admitting: Internal Medicine

## 2016-04-08 NOTE — Telephone Encounter (Signed)
Sent to the pharmacy by e-scribe. 

## 2016-09-15 ENCOUNTER — Other Ambulatory Visit: Payer: Self-pay | Admitting: Internal Medicine

## 2016-11-06 NOTE — Progress Notes (Signed)
Chief Complaint  Patient presents with  . Annual Exam    HPI: Patient  Carol Mcdaniel  66 y.o. comes in today for [place] visit   bp low 100 s  Has lost 20 pounds by exercising and running and doing very healthy Mediterranean diet. SHe stopped the Lipitor after short while because of body aches went back on it got the same symptoms so she stopped it altogether. wellbutrin helpful  Wants to remain on it  No  New health concerns  To get  rx for contractures  Non surgical as gets keloids  ( family hx) Something bit the oter day left scalpneck area and now less  Swollen    Health Maintenance  Topic Date Due  . COLONOSCOPY  04/18/2013  . DEXA SCAN  08/06/2015  . PNA vac Low Risk Adult (1 of 2 - PCV13) 08/06/2015  . INFLUENZA VACCINE  12/17/2016  . MAMMOGRAM  10/23/2017  . TETANUS/TDAP  11/18/2020  . Hepatitis C Screening  Completed   Health Maintenance Review LIFESTYLE:  Exercise:  Run 2 miles per day .  20 push ups.  Tobacco/ETS:  no Alcohol:  no Sugar beverages: rare  Sleep: 7-8  Drug use: no HH of  1 dog and cat  Work: FT  10 - 60   Less  Summer.  To retire  In a year.     ROS:  To get vision check  To schedule.   GEN/ HEENT: No fever, significant weight changes sweats headaches vision problems hearing changes, CV/ PULM; No chest pain shortness of breath cough, syncope,edema  change in exercise tolerance. GI /GU: No adominal pain, vomiting, change in bowel habits. No blood in the stool. No significant GU symptoms. SKIN/HEME: ,no acute skin rashes suspicious lesions or bleeding. No lymphadenopathy, nodules, masses.  NEURO/ PSYCH:  No neurologic signs such as weakness numbness. No depression anxiety. IMM/ Allergy: No unusual infections.  Allergy .   REST of 12 system review negative except as per HPI   Past Medical History:  Diagnosis Date  . ADJ DISORDER WITH MIXED ANXIETY & DEPRESSED MOOD 03/06/2008   Qualifier: Diagnosis of  By: Regis Bill MD,  Standley Brooking   . Fracture    arm in HS  . Heart murmur    no eval ? functional  . History of chickenpox   . Hx of Moh's micrographic surgery for skin cancer 2013   r arm   . LIVER FUNCTION TESTS, ABNORMAL 04/04/2008   Qualifier: Diagnosis of  By: Regis Bill MD, Standley Brooking Poss related to intense exercise    . Renal stone    ?  Marland Kitchen Trans-sexualism with unspecified sexual history     Past Surgical History:  Procedure Laterality Date  . arthoscopic surgery for herniated disc  01/1997   [place]  . MOHS SURGERY     right arm 2013  . TONSILLECTOMY    . 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 Alcohol abuse Father        died 31  . Other Brother        overweight    Social History   Social History  . Marital status: Single    Spouse name: N/A  . Number of children: N/A  . Years of education: N/A   Social History Main Topics  . Smoking status: Never Smoker  . Smokeless tobacco: Never Used  . Alcohol use  No  . Drug use: No  . Sexual activity: Not Asked   Other Topics Concern  . 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    Plays musical instruments violin summer perfromances    Mom passed in 2016   Neg tad   Continued teaching moved  to [place] to retire  Next year 2019                   Outpatient Medications Prior to Visit  Medication Sig Dispense Refill  . estradiol (CLIMARA - DOSED IN MG/24 HR) 0.1 mg/24hr patch APPLY ONE PATCH TOPICALLY EVERY WEEK 12 patch 0  . buPROPion (WELLBUTRIN XL) 150 MG 24 hr tablet TAKE TWO TABLETS BY MOUTH EVERY DAY 180 tablet 0  . atorvastatin (LIPITOR)  20 MG tablet TAKE ONE TABLET BY MOUTH EVERY DAY (Patient not taking: Reported on 11/07/2016) 30 tablet 5   No facility-administered medications prior to visit.      EXAM:  BP 100/80 (BP Location: Right Arm, Patient Position: Sitting, Cuff Size: Normal)   Pulse (!) 54   Ht 5' 8.5" (1.74 m)   Wt 142 lb 11.2 oz (64.7 kg)   BMI 21.38 kg/m   Body mass index is 21.38 kg/m. Wt Readings from Last 3 Encounters:  11/07/16 142 lb 11.2 oz (64.7 kg)  11/07/15 165 lb 6.4 oz (75 kg)  11/06/14 157 lb 6.4 oz (71.4 kg)    Physical Exam: Vital signs reviewed NOB:SJGG is a well-developed well-nourished alert cooperative    who appearsr stated age in no acute distress.  HEENT: normocephalic atraumatic , left PO are with 1 cm cytuic vx lm area pin with superior  Red dot   Noted min tendernesss  Eyes: PERRL EOM's full, conjunctiva clear, Nares: paten,t no deformity discharge or tenderness., Ears: no deformity EAC's clear TMs with normal landmarks. Mouth: clear OP, no lesions, edema.  Moist mucous membranes. Dentition in adequate repair. NECK: supple without masses, thyromegaly or bruits. CHEST/PULM:  Clear to auscultation and percussion breath sounds equal no wheeze , rales or rhonchi. No chest wall deformities or tenderness. Breast: normal by inspection . No dimpling, discharge, masses, tenderness or discharge . CV: PMI is nondisplaced, S1 S2 no gallops, murmurs, rubs. Peripheral pulses are full without delay.No JVD .  ABDOMEN: Bowel sounds normal nontender  No guard or rebound, no hepato splenomegal no CVA tenderness.  No hernia. Extremtities:  No clubbing cyanosis or edema, no acute joint swelling or redness no focal atrophy dupuytren contracture 5th left  NEURO:  Oriented x3, cranial nerves 3-12 appear to be intact, no obvious focal weakness,gait within normal limits no abnormal reflexes or asymmetrical SKIN: No acute rashes normal turgor, color, no bruising or petechiae. PSYCH: Oriented, good eye  contact, no obvious depression anxiety, cognition and judgment appear normal. LN: no cervical axillary inguinal adenopathy  Lab Results  Component Value Date   WBC 5.3 11/07/2016   HGB 13.3 11/07/2016   HCT 39.7 11/07/2016   PLT 229.0 11/07/2016   GLUCOSE 85 11/07/2016   CHOL 258 (  H) 11/07/2016   TRIG 64.0 11/07/2016   HDL 86.80 11/07/2016   LDLDIRECT 140.6 06/06/2013   LDLCALC 158 (H) 11/07/2016   ALT 32 11/07/2016   AST 33 11/07/2016   NA 140 11/07/2016   K 4.2 11/07/2016   CL 102 11/07/2016   CREATININE 1.00 11/07/2016   BUN 24 (H) 11/07/2016   CO2 31 11/07/2016   TSH 1.12 11/07/2015    BP Readings from Last 3 Encounters:  11/07/16 100/80  11/07/15 132/90  11/06/14 118/80   Wt Readings from Last 3 Encounters:  11/07/16 142 lb 11.2 oz (64.7 kg)  11/07/15 165 lb 6.4 oz (75 kg)  11/06/14 157 lb 6.4 oz (71.4 kg)    Lab results reviewed with patient   ASSESSMENT AND PLAN:  Discussed the following assessment and plan:  Visit for preventive health examination - Plan: Basic metabolic panel, CBC with Differential/Platelet, Hepatic function panel, Lipid panel  Medication management - cont wellbutrin and patch - Plan: Basic metabolic panel, CBC with Differential/Platelet, Hepatic function panel, Lipid panel  Hyperlipidemia, unspecified hyperlipidemia type - se lipitor  improved with lsi  follow  - Plan: Basic metabolic panel, CBC with Differential/Platelet, Hepatic function panel, Lipid panel  Need for prophylactic vaccination against Streptococcus pneumoniae (pneumococcus) - Plan: Pneumococcal conjugate vaccine 13-valent IM  Need for shingles vaccine - Plan: Varicella-zoster vaccine IM (Shingrix)  Dupuytren contracture  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 agree with stopping the atorvastatin  And may not be needed  At this time   Will notify you  of labs when available.   Get your mammo and colon  .   Order for bone density  DEXA scan  When convenient.   shingrix today and second one to be obtained  In 2-6 mos      Preventive Care 86 Years and Older, Female Preventive care refers to lifestyle choices and visits with your health care provider that can promote health and wellness. What does preventive care include?  A yearly physical exam. This is also called an annual well check.  Dental exams once or twice a year.  Routine eye exams. Ask your health care provider how often you should have your eyes checked.  Personal lifestyle choices, including: ? Daily care of your teeth and gums. ? Regular physical activity. ? Eating a healthy diet. ? Avoiding tobacco and drug use. ? Limiting alcohol use. ? Practicing safe sex. ? Taking low-dose aspirin every day. ? Taking vitamin and mineral supplements as recommended by your health care provider. What happens during an annual well check? The services and screenings done by your health care provider during your annual well check will depend on your age, overall health, lifestyle risk factors, and family history of disease. Counseling Your health care provider may ask you questions about your:  Alcohol use.  Tobacco use.  Drug use.  Emotional well-being.  Home and relationship well-being.  Sexual activity.  Eating habits.  History of falls.  Memory and ability to understand (cognition).  Work and work Statistician.  Reproductive health.  Screening  You may have the following tests or measurements:  Height, weight, and BMI.  Blood pressure.  Lipid and cholesterol  levels. These may be checked every 5 years, or more frequently if you are over 37 years old.  Skin check.  Lung cancer screening. You may have this screening every year starting at age 84 if you have a 30-pack-year history of smoking and currently smoke or have quit within the past 15 years.  Fecal occult blood test (FOBT) of the stool. You may have this test every year starting at age 98.  Flexible sigmoidoscopy or colonoscopy. You may have a sigmoidoscopy every 5 years or a colonoscopy every 10 years starting at age 33.  Hepatitis C blood test.  Hepatitis B blood test.  Sexually transmitted disease (STD) testing.  Diabetes screening. This is done by checking your blood sugar (glucose) after you have not eaten for a while (fasting). You may have this done every 1-3 years.  Bone density scan. This is done to screen for osteoporosis. You may have this done starting at age 33.  Mammogram. This may be done every 1-2 years. Talk to your health care provider about how often you should have regular mammograms.  Talk with your health care provider about your test results, treatment options, and if necessary, the need for more tests. Vaccines Your health care provider may recommend certain vaccines, such as:  Influenza vaccine. This is recommended every year.  Tetanus, diphtheria, and acellular pertussis (Tdap, Td) vaccine. You may need a Td booster every 10 years.  Varicella vaccine. You may need this if you have not been vaccinated.  Zoster vaccine. You may need this after age 67.  Measles, mumps, and rubella (MMR) vaccine. You may need at least one dose of MMR if you were born in 1957 or later. You may also need a second dose.  Pneumococcal 13-valent conjugate (PCV13) vaccine. One dose is recommended after age 32.  Pneumococcal polysaccharide (PPSV23) vaccine. One dose is recommended after age 15.  Meningococcal vaccine. You may need this if you have certain  conditions.  Hepatitis A vaccine. You may need this if you have certain conditions or if you travel or work in places where you may be exposed to hepatitis A.  Hepatitis B vaccine. You may need this if you have certain conditions or if you travel or work in places where you may be exposed to hepatitis B.  Haemophilus influenzae type b (Hib) vaccine. You may need this if you have certain conditions.  Talk to your health care provider about which screenings and vaccines you need and how often you need them. This information is not intended to replace advice given to you by your health care provider. Make sure you discuss any questions you have with your health care provider. Document Released: 06/01/2015 Document Revised: 01/23/2016 Document Reviewed: 03/06/2015 Elsevier Interactive Patient Education  2017 Elsevier Inc.   Bone Densitometry Bone densitometry is an imaging test that uses a special X-ray to measure the amount of calcium and other minerals in your bones (bone density). This test is also known as a bone mineral density test or dual-energy X-ray absorptiometry (DXA). The test can measure bone density at your hip and your spine. It is similar to having a regular X-ray. You may have this test to:  Diagnose a condition that causes weak or thin bones (osteoporosis).  Predict your risk of a broken bone (fracture).  Determine how well osteoporosis treatment is working.  Tell a health  care provider about:  Any allergies you have.  All medicines you are taking, including vitamins, herbs, eye drops, creams, and over-the-counter medicines.  Any problems you or family members have had with anesthetic medicines.  Any blood disorders you have.  Any surgeries you have had.  Any medical conditions you have.  Possibility of pregnancy.  Any other medical test you had within the previous 14 days that used contrast material. What are the risks? Generally, this is a safe procedure.  However, problems can occur and may include the following:  This test exposes you to a very small amount of radiation.  The risks of radiation exposure may be greater to unborn children.  What happens before the procedure?  Do not take any calcium supplements for 24 hours before having the test. You can otherwise eat and drink what you usually do.  Take off all metal jewelry, eyeglasses, dental appliances, and any other metal objects. What happens during the procedure?  You may lie on an exam table. There will be an X-ray generator below you and an imaging device above you.  Other devices, such as boxes or braces, may be used to position your body properly for the scan.  You will need to lie still while the machine slowly scans your body.  The images will show up on a computer monitor. What happens after the procedure? You may need more testing at a later time. This information is not intended to replace advice given to you by your health care provider. Make sure you discuss any questions you have with your health care provider. Document Released: 05/27/2004 Document Revised: 10/11/2015 Document Reviewed: 10/13/2013 Elsevier Interactive Patient Education  2018 [place]. Savon Cobbs M.D.

## 2016-11-07 ENCOUNTER — Encounter: Payer: Self-pay | Admitting: Internal Medicine

## 2016-11-07 ENCOUNTER — Ambulatory Visit (INDEPENDENT_AMBULATORY_CARE_PROVIDER_SITE_OTHER): Payer: BLUE CROSS/BLUE SHIELD | Admitting: Internal Medicine

## 2016-11-07 VITALS — BP 100/80 | HR 54 | Ht 68.5 in | Wt 142.7 lb

## 2016-11-07 DIAGNOSIS — E785 Hyperlipidemia, unspecified: Secondary | ICD-10-CM | POA: Diagnosis not present

## 2016-11-07 DIAGNOSIS — Z79899 Other long term (current) drug therapy: Secondary | ICD-10-CM

## 2016-11-07 DIAGNOSIS — Z0000 Encounter for general adult medical examination without abnormal findings: Secondary | ICD-10-CM | POA: Diagnosis not present

## 2016-11-07 DIAGNOSIS — Z23 Encounter for immunization: Secondary | ICD-10-CM | POA: Diagnosis not present

## 2016-11-07 DIAGNOSIS — M720 Palmar fascial fibromatosis [Dupuytren]: Secondary | ICD-10-CM | POA: Diagnosis not present

## 2016-11-07 DIAGNOSIS — Z789 Other specified health status: Secondary | ICD-10-CM

## 2016-11-07 DIAGNOSIS — F640 Transsexualism: Secondary | ICD-10-CM

## 2016-11-07 LAB — CBC WITH DIFFERENTIAL/PLATELET
Basophils Absolute: 0.1 10*3/uL (ref 0.0–0.1)
Basophils Relative: 1.1 % (ref 0.0–3.0)
EOS PCT: 1.3 % (ref 0.0–5.0)
Eosinophils Absolute: 0.1 10*3/uL (ref 0.0–0.7)
HEMATOCRIT: 39.7 % (ref 36.0–46.0)
HEMOGLOBIN: 13.3 g/dL (ref 12.0–15.0)
LYMPHS ABS: 1.4 10*3/uL (ref 0.7–4.0)
LYMPHS PCT: 25.9 % (ref 12.0–46.0)
MCHC: 33.6 g/dL (ref 30.0–36.0)
MCV: 93.6 fl (ref 78.0–100.0)
MONOS PCT: 7.9 % (ref 3.0–12.0)
Monocytes Absolute: 0.4 10*3/uL (ref 0.1–1.0)
Neutro Abs: 3.4 10*3/uL (ref 1.4–7.7)
Neutrophils Relative %: 63.8 % (ref 43.0–77.0)
Platelets: 229 10*3/uL (ref 150.0–400.0)
RBC: 4.24 Mil/uL (ref 3.87–5.11)
RDW: 13.8 % (ref 11.5–15.5)
WBC: 5.3 10*3/uL (ref 4.0–10.5)

## 2016-11-07 LAB — LIPID PANEL
CHOLESTEROL: 258 mg/dL — AB (ref 0–200)
HDL: 86.8 mg/dL (ref >39.00)
LDL Cholesterol: 158 mg/dL — ABNORMAL HIGH (ref 0–99)
NonHDL: 171.27
Total CHOL/HDL Ratio: 3
Triglycerides: 64 mg/dL (ref 0.0–149.0)
VLDL: 12.8 mg/dL (ref 0.0–40.0)

## 2016-11-07 LAB — HEPATIC FUNCTION PANEL
ALBUMIN: 4.6 g/dL (ref 3.5–5.2)
ALT: 32 U/L (ref 0–35)
AST: 33 U/L (ref 0–37)
Alkaline Phosphatase: 39 U/L (ref 39–117)
Bilirubin, Direct: 0.2 mg/dL (ref 0.0–0.3)
TOTAL PROTEIN: 6.7 g/dL (ref 6.0–8.3)
Total Bilirubin: 0.9 mg/dL (ref 0.2–1.2)

## 2016-11-07 LAB — BASIC METABOLIC PANEL
BUN: 24 mg/dL — AB (ref 6–23)
CHLORIDE: 102 meq/L (ref 96–112)
CO2: 31 mEq/L (ref 19–32)
CREATININE: 1 mg/dL (ref 0.40–1.20)
Calcium: 9.5 mg/dL (ref 8.4–10.5)
GFR: 58.91 mL/min — AB (ref >60.00)
GLUCOSE: 85 mg/dL (ref 70–99)
Potassium: 4.2 mEq/L (ref 3.5–5.1)
Sodium: 140 mEq/L (ref 135–145)

## 2016-11-07 MED ORDER — BUPROPION HCL ER (XL) 150 MG PO TB24: 300.0000 mg | ORAL_TABLET | Freq: Every day | ORAL | 3 refills | Status: DC

## 2016-11-07 NOTE — Patient Instructions (Addendum)
Continue lifestyle intervention healthy eating and exercise . agree with stopping the atorvastatin  And may not be needed  At this time   Will notify you  of labs when available.   Get your mammo and colon  .   Order for bone density  DEXA scan  When convenient.   shingrix today and second one to be obtained  In 2-6 mos      Preventive Care 55 Years and Older, Female Preventive care refers to lifestyle choices and visits with your health care provider that can promote health and wellness. What does preventive care include?  A yearly physical exam. This is also called an annual well check.  Dental exams once or twice a year.  Routine eye exams. Ask your health care provider how often you should have your eyes checked.  Personal lifestyle choices, including: ? Daily care of your teeth and gums. ? Regular physical activity. ? Eating a healthy diet. ? Avoiding tobacco and drug use. ? Limiting alcohol use. ? Practicing safe sex. ? Taking low-dose aspirin every day. ? Taking vitamin and mineral supplements as recommended by your health care provider. What happens during an annual well check? The services and screenings done by your health care provider during your annual well check will depend on your age, overall health, lifestyle risk factors, and family history of disease. Counseling Your health care provider may ask you questions about your:  Alcohol use.  Tobacco use.  Drug use.  Emotional well-being.  Home and relationship well-being.  Sexual activity.  Eating habits.  History of falls.  Memory and ability to understand (cognition).  Work and work Statistician.  Reproductive health.  Screening You may have the following tests or measurements:  Height, weight, and BMI.  Blood pressure.  Lipid and cholesterol levels. These may be checked every 5 years, or more frequently if you are over 13 years old.  Skin check.  Lung cancer screening. You may have  this screening every year starting at age 60 if you have a 30-pack-year history of smoking and currently smoke or have quit within the past 15 years.  Fecal occult blood test (FOBT) of the stool. You may have this test every year starting at age 70.  Flexible sigmoidoscopy or colonoscopy. You may have a sigmoidoscopy every 5 years or a colonoscopy every 10 years starting at age 26.  Hepatitis C blood test.  Hepatitis B blood test.  Sexually transmitted disease (STD) testing.  Diabetes screening. This is done by checking your blood sugar (glucose) after you have not eaten for a while (fasting). You may have this done every 1-3 years.  Bone density scan. This is done to screen for osteoporosis. You may have this done starting at age 79.  Mammogram. This may be done every 1-2 years. Talk to your health care provider about how often you should have regular mammograms.  Talk with your health care provider about your test results, treatment options, and if necessary, the need for more tests. Vaccines Your health care provider may recommend certain vaccines, such as:  Influenza vaccine. This is recommended every year.  Tetanus, diphtheria, and acellular pertussis (Tdap, Td) vaccine. You may need a Td booster every 10 years.  Varicella vaccine. You may need this if you have not been vaccinated.  Zoster vaccine. You may need this after age 19.  Measles, mumps, and rubella (MMR) vaccine. You may need at least one dose of MMR if you were born in 1957 or  later. You may also need a second dose.  Pneumococcal 13-valent conjugate (PCV13) vaccine. One dose is recommended after age 58.  Pneumococcal polysaccharide (PPSV23) vaccine. One dose is recommended after age 60.  Meningococcal vaccine. You may need this if you have certain conditions.  Hepatitis A vaccine. You may need this if you have certain conditions or if you travel or work in places where you may be exposed to hepatitis  A.  Hepatitis B vaccine. You may need this if you have certain conditions or if you travel or work in places where you may be exposed to hepatitis B.  Haemophilus influenzae type b (Hib) vaccine. You may need this if you have certain conditions.  Talk to your health care provider about which screenings and vaccines you need and how often you need them. This information is not intended to replace advice given to you by your health care provider. Make sure you discuss any questions you have with your health care provider. Document Released: 06/01/2015 Document Revised: 01/23/2016 Document Reviewed: 03/06/2015 Elsevier Interactive Patient Education  2017 [place].   Bone Densitometry Bone densitometry is an imaging test that uses a special X-ray to measure the amount of calcium and other minerals in your bones (bone density). This test is also known as a bone mineral density test or dual-energy X-ray absorptiometry (DXA). The test can measure bone density at your hip and your spine. It is similar to having a regular X-ray. You may have this test to:  Diagnose a condition that causes weak or thin bones (osteoporosis).  Predict your risk of a broken bone (fracture).  Determine how well osteoporosis treatment is working.  Tell a health care provider about:  Any allergies you have.  All medicines you are taking, including vitamins, herbs, eye drops, creams, and over-the-counter medicines.  Any problems you or family members have had with anesthetic medicines.  Any blood disorders you have.  Any surgeries you have had.  Any medical conditions you have.  Possibility of pregnancy.  Any other medical test you had within the previous 14 days that used contrast material. What are the risks? Generally, this is a safe procedure. However, problems can occur and may include the following:  This test exposes you to a very small amount of radiation.  The risks of radiation exposure may  be greater to unborn children.  What happens before the procedure?  Do not take any calcium supplements for 24 hours before having the test. You can otherwise eat and drink what you usually do.  Take off all metal jewelry, eyeglasses, dental appliances, and any other metal objects. What happens during the procedure?  You may lie on an exam table. There will be an X-ray generator below you and an imaging device above you.  Other devices, such as boxes or braces, may be used to position your body properly for the scan.  You will need to lie still while the machine slowly scans your body.  The images will show up on a computer monitor. What happens after the procedure? You may need more testing at a later time. This information is not intended to replace advice given to you by your health care provider. Make sure you discuss any questions you have with your health care provider. Document Released: 05/27/2004 Document Revised: 10/11/2015 Document Reviewed: 10/13/2013 Elsevier Interactive Patient Education  2018 [employer].

## 2016-12-29 ENCOUNTER — Encounter: Payer: Self-pay | Admitting: Internal Medicine

## 2016-12-29 ENCOUNTER — Other Ambulatory Visit: Payer: Self-pay

## 2016-12-29 MED ORDER — BUPROPION HCL ER (XL) 150 MG PO TB24: 300.0000 mg | ORAL_TABLET | Freq: Every day | ORAL | 0 refills | Status: DC

## 2017-01-16 ENCOUNTER — Other Ambulatory Visit: Payer: Self-pay | Admitting: Internal Medicine

## 2017-02-06 ENCOUNTER — Encounter: Payer: Self-pay | Admitting: Internal Medicine

## 2017-02-12 ENCOUNTER — Ambulatory Visit (INDEPENDENT_AMBULATORY_CARE_PROVIDER_SITE_OTHER): Payer: BLUE CROSS/BLUE SHIELD

## 2017-02-12 DIAGNOSIS — Z23 Encounter for immunization: Secondary | ICD-10-CM | POA: Diagnosis not present

## 2017-04-17 ENCOUNTER — Other Ambulatory Visit: Payer: Self-pay | Admitting: Internal Medicine

## 2017-10-06 ENCOUNTER — Other Ambulatory Visit: Payer: Self-pay | Admitting: Internal Medicine

## 2017-11-03 ENCOUNTER — Other Ambulatory Visit: Payer: Self-pay | Admitting: Internal Medicine

## 2017-11-09 ENCOUNTER — Other Ambulatory Visit: Payer: Self-pay | Admitting: Internal Medicine

## 2017-11-11 NOTE — Telephone Encounter (Signed)
Sent to the pharmacy by e-scribe for 30 days.  Spoke to the pt and informed her that she is past due for cpx.  She will check her calendar and call back.

## 2017-12-01 ENCOUNTER — Other Ambulatory Visit: Payer: Self-pay | Admitting: Internal Medicine

## 2017-12-07 ENCOUNTER — Encounter: Payer: Self-pay | Admitting: Internal Medicine

## 2017-12-09 ENCOUNTER — Other Ambulatory Visit: Payer: Self-pay | Admitting: Internal Medicine

## 2017-12-09 NOTE — Telephone Encounter (Signed)
Last OV 11/07/2016   Last refilled 11/11/2017 disp 60 with no refills   Pt is due for an OV

## 2017-12-10 NOTE — Telephone Encounter (Signed)
Left message to return phone call to schedule appt.  

## 2017-12-10 NOTE — Telephone Encounter (Signed)
Please contact  patient  To get on appt schedule and then can refill medication for 2 months    If she has moved location then get information about plans for fu so we can help ins transition

## 2018-01-04 ENCOUNTER — Other Ambulatory Visit: Payer: Self-pay | Admitting: Internal Medicine

## 2018-01-30 ENCOUNTER — Other Ambulatory Visit: Payer: Self-pay | Admitting: Internal Medicine

## 2018-03-01 ENCOUNTER — Other Ambulatory Visit: Payer: Self-pay | Admitting: Internal Medicine

## 2018-03-29 ENCOUNTER — Other Ambulatory Visit: Payer: Self-pay | Admitting: Internal Medicine
